# Patient Record
Sex: Male | Born: 1965
Health system: Southern US, Community
[De-identification: ages and names within clinical notes are randomized; demographics above are authoritative.]

## PROBLEM LIST (undated history)

## (undated) DIAGNOSIS — T7840XA Allergy, unspecified, initial encounter: Secondary | ICD-10-CM

## (undated) DIAGNOSIS — H4720 Unspecified optic atrophy: Secondary | ICD-10-CM

## (undated) DIAGNOSIS — G839 Paralytic syndrome, unspecified: Secondary | ICD-10-CM

## (undated) DIAGNOSIS — E785 Hyperlipidemia, unspecified: Secondary | ICD-10-CM

## (undated) DIAGNOSIS — K219 Gastro-esophageal reflux disease without esophagitis: Secondary | ICD-10-CM

## (undated) DIAGNOSIS — E119 Type 2 diabetes mellitus without complications: Secondary | ICD-10-CM

## (undated) DIAGNOSIS — I10 Essential (primary) hypertension: Secondary | ICD-10-CM

## (undated) DIAGNOSIS — G629 Polyneuropathy, unspecified: Secondary | ICD-10-CM

## (undated) HISTORY — DX: Polyneuropathy, unspecified: G62.9

## (undated) HISTORY — DX: Paralytic syndrome, unspecified: G83.9

## (undated) HISTORY — DX: Hyperlipidemia, unspecified: E78.5

## (undated) HISTORY — DX: Gastro-esophageal reflux disease without esophagitis: K21.9

## (undated) HISTORY — PX: LEG SURGERY: SHX1003

## (undated) HISTORY — DX: Unspecified optic atrophy: H47.20

## (undated) HISTORY — DX: Essential (primary) hypertension: I10

## (undated) HISTORY — DX: Type 2 diabetes mellitus without complications: E11.9

## (undated) HISTORY — DX: Allergy, unspecified, initial encounter: T78.40XA

---

## 1995-04-11 DIAGNOSIS — E119 Type 2 diabetes mellitus without complications: Secondary | ICD-10-CM

## 1995-04-11 HISTORY — DX: Type 2 diabetes mellitus without complications: E11.9

## 2013-04-27 DIAGNOSIS — R209 Unspecified disturbances of skin sensation: Secondary | ICD-10-CM | POA: Diagnosis not present

## 2013-05-22 DIAGNOSIS — E119 Type 2 diabetes mellitus without complications: Secondary | ICD-10-CM | POA: Diagnosis not present

## 2013-05-22 DIAGNOSIS — I10 Essential (primary) hypertension: Secondary | ICD-10-CM | POA: Diagnosis not present

## 2013-06-06 DIAGNOSIS — R51 Headache: Secondary | ICD-10-CM | POA: Diagnosis not present

## 2013-06-06 DIAGNOSIS — R05 Cough: Secondary | ICD-10-CM | POA: Diagnosis not present

## 2013-06-06 DIAGNOSIS — R059 Cough, unspecified: Secondary | ICD-10-CM | POA: Diagnosis not present

## 2013-06-06 DIAGNOSIS — J811 Chronic pulmonary edema: Secondary | ICD-10-CM | POA: Diagnosis not present

## 2013-08-24 DIAGNOSIS — D649 Anemia, unspecified: Secondary | ICD-10-CM | POA: Diagnosis not present

## 2013-08-24 DIAGNOSIS — K219 Gastro-esophageal reflux disease without esophagitis: Secondary | ICD-10-CM | POA: Diagnosis not present

## 2013-08-24 DIAGNOSIS — R279 Unspecified lack of coordination: Secondary | ICD-10-CM | POA: Diagnosis not present

## 2013-08-24 DIAGNOSIS — R609 Edema, unspecified: Secondary | ICD-10-CM | POA: Diagnosis not present

## 2013-08-24 DIAGNOSIS — IMO0001 Reserved for inherently not codable concepts without codable children: Secondary | ICD-10-CM | POA: Diagnosis not present

## 2013-08-24 DIAGNOSIS — E109 Type 1 diabetes mellitus without complications: Secondary | ICD-10-CM | POA: Diagnosis not present

## 2013-08-24 DIAGNOSIS — R51 Headache: Secondary | ICD-10-CM | POA: Diagnosis not present

## 2013-10-08 DIAGNOSIS — R05 Cough: Secondary | ICD-10-CM | POA: Diagnosis not present

## 2013-10-08 DIAGNOSIS — R059 Cough, unspecified: Secondary | ICD-10-CM | POA: Diagnosis not present

## 2013-10-11 DIAGNOSIS — R059 Cough, unspecified: Secondary | ICD-10-CM | POA: Diagnosis not present

## 2013-10-11 DIAGNOSIS — J301 Allergic rhinitis due to pollen: Secondary | ICD-10-CM | POA: Diagnosis not present

## 2013-10-11 DIAGNOSIS — R05 Cough: Secondary | ICD-10-CM | POA: Diagnosis not present

## 2013-11-20 DIAGNOSIS — E119 Type 2 diabetes mellitus without complications: Secondary | ICD-10-CM | POA: Diagnosis not present

## 2013-11-20 DIAGNOSIS — I10 Essential (primary) hypertension: Secondary | ICD-10-CM | POA: Diagnosis not present

## 2013-12-09 DIAGNOSIS — E119 Type 2 diabetes mellitus without complications: Secondary | ICD-10-CM | POA: Diagnosis not present

## 2014-01-13 DIAGNOSIS — E109 Type 1 diabetes mellitus without complications: Secondary | ICD-10-CM | POA: Diagnosis not present

## 2014-01-13 DIAGNOSIS — K219 Gastro-esophageal reflux disease without esophagitis: Secondary | ICD-10-CM | POA: Diagnosis not present

## 2014-01-13 DIAGNOSIS — R279 Unspecified lack of coordination: Secondary | ICD-10-CM | POA: Diagnosis not present

## 2014-01-13 DIAGNOSIS — R51 Headache: Secondary | ICD-10-CM | POA: Diagnosis not present

## 2014-01-15 DIAGNOSIS — I10 Essential (primary) hypertension: Secondary | ICD-10-CM | POA: Diagnosis not present

## 2014-01-15 DIAGNOSIS — E119 Type 2 diabetes mellitus without complications: Secondary | ICD-10-CM | POA: Diagnosis not present

## 2014-02-07 DIAGNOSIS — E785 Hyperlipidemia, unspecified: Secondary | ICD-10-CM | POA: Diagnosis not present

## 2014-02-07 DIAGNOSIS — I10 Essential (primary) hypertension: Secondary | ICD-10-CM | POA: Diagnosis not present

## 2014-02-07 DIAGNOSIS — H540 Blindness, both eyes: Secondary | ICD-10-CM | POA: Diagnosis not present

## 2014-02-07 DIAGNOSIS — G0481 Other encephalitis and encephalomyelitis: Secondary | ICD-10-CM | POA: Diagnosis not present

## 2014-02-07 DIAGNOSIS — D509 Iron deficiency anemia, unspecified: Secondary | ICD-10-CM | POA: Diagnosis not present

## 2014-02-25 DIAGNOSIS — I10 Essential (primary) hypertension: Secondary | ICD-10-CM | POA: Diagnosis not present

## 2014-02-26 DIAGNOSIS — E119 Type 2 diabetes mellitus without complications: Secondary | ICD-10-CM | POA: Diagnosis not present

## 2014-02-26 DIAGNOSIS — I10 Essential (primary) hypertension: Secondary | ICD-10-CM | POA: Diagnosis not present

## 2014-03-23 DIAGNOSIS — I10 Essential (primary) hypertension: Secondary | ICD-10-CM | POA: Diagnosis not present

## 2014-03-23 DIAGNOSIS — H540 Blindness, both eyes: Secondary | ICD-10-CM | POA: Diagnosis not present

## 2014-03-23 DIAGNOSIS — E119 Type 2 diabetes mellitus without complications: Secondary | ICD-10-CM | POA: Diagnosis not present

## 2014-03-23 DIAGNOSIS — E785 Hyperlipidemia, unspecified: Secondary | ICD-10-CM | POA: Diagnosis not present

## 2014-03-23 DIAGNOSIS — G808 Other cerebral palsy: Secondary | ICD-10-CM | POA: Diagnosis not present

## 2014-05-27 DIAGNOSIS — I10 Essential (primary) hypertension: Secondary | ICD-10-CM | POA: Diagnosis not present

## 2014-05-27 DIAGNOSIS — E785 Hyperlipidemia, unspecified: Secondary | ICD-10-CM | POA: Diagnosis not present

## 2014-05-27 DIAGNOSIS — D649 Anemia, unspecified: Secondary | ICD-10-CM | POA: Diagnosis not present

## 2014-05-27 DIAGNOSIS — G370 Diffuse sclerosis of central nervous system: Secondary | ICD-10-CM | POA: Diagnosis not present

## 2014-05-27 DIAGNOSIS — E108 Type 1 diabetes mellitus with unspecified complications: Secondary | ICD-10-CM | POA: Diagnosis not present

## 2014-05-27 DIAGNOSIS — G8110 Spastic hemiplegia affecting unspecified side: Secondary | ICD-10-CM | POA: Diagnosis not present

## 2014-06-02 DIAGNOSIS — E119 Type 2 diabetes mellitus without complications: Secondary | ICD-10-CM | POA: Diagnosis not present

## 2014-07-16 DIAGNOSIS — H540 Blindness, both eyes: Secondary | ICD-10-CM | POA: Diagnosis not present

## 2014-07-16 DIAGNOSIS — D649 Anemia, unspecified: Secondary | ICD-10-CM | POA: Diagnosis not present

## 2014-07-16 DIAGNOSIS — R252 Cramp and spasm: Secondary | ICD-10-CM | POA: Diagnosis not present

## 2014-07-16 DIAGNOSIS — E785 Hyperlipidemia, unspecified: Secondary | ICD-10-CM | POA: Diagnosis not present

## 2014-07-16 DIAGNOSIS — I10 Essential (primary) hypertension: Secondary | ICD-10-CM | POA: Diagnosis not present

## 2014-07-16 DIAGNOSIS — E109 Type 1 diabetes mellitus without complications: Secondary | ICD-10-CM | POA: Diagnosis not present

## 2014-10-01 DIAGNOSIS — D649 Anemia, unspecified: Secondary | ICD-10-CM | POA: Diagnosis not present

## 2014-10-01 DIAGNOSIS — H540 Blindness, both eyes: Secondary | ICD-10-CM | POA: Diagnosis not present

## 2014-10-01 DIAGNOSIS — I10 Essential (primary) hypertension: Secondary | ICD-10-CM | POA: Diagnosis not present

## 2014-10-01 DIAGNOSIS — R252 Cramp and spasm: Secondary | ICD-10-CM | POA: Diagnosis not present

## 2014-10-01 DIAGNOSIS — E785 Hyperlipidemia, unspecified: Secondary | ICD-10-CM | POA: Diagnosis not present

## 2014-10-01 DIAGNOSIS — E109 Type 1 diabetes mellitus without complications: Secondary | ICD-10-CM | POA: Diagnosis not present

## 2014-11-12 DIAGNOSIS — I10 Essential (primary) hypertension: Secondary | ICD-10-CM | POA: Diagnosis not present

## 2014-11-12 DIAGNOSIS — E119 Type 2 diabetes mellitus without complications: Secondary | ICD-10-CM | POA: Diagnosis not present

## 2014-12-02 DIAGNOSIS — E119 Type 2 diabetes mellitus without complications: Secondary | ICD-10-CM | POA: Diagnosis not present

## 2014-12-02 DIAGNOSIS — R252 Cramp and spasm: Secondary | ICD-10-CM | POA: Diagnosis not present

## 2014-12-02 DIAGNOSIS — D649 Anemia, unspecified: Secondary | ICD-10-CM | POA: Diagnosis not present

## 2014-12-02 DIAGNOSIS — I10 Essential (primary) hypertension: Secondary | ICD-10-CM | POA: Diagnosis not present

## 2014-12-02 DIAGNOSIS — E785 Hyperlipidemia, unspecified: Secondary | ICD-10-CM | POA: Diagnosis not present

## 2014-12-02 DIAGNOSIS — H540 Blindness, both eyes: Secondary | ICD-10-CM | POA: Diagnosis not present

## 2014-12-08 DIAGNOSIS — J309 Allergic rhinitis, unspecified: Secondary | ICD-10-CM | POA: Diagnosis not present

## 2014-12-08 DIAGNOSIS — G609 Hereditary and idiopathic neuropathy, unspecified: Secondary | ICD-10-CM | POA: Diagnosis not present

## 2014-12-08 DIAGNOSIS — E785 Hyperlipidemia, unspecified: Secondary | ICD-10-CM | POA: Diagnosis not present

## 2014-12-08 DIAGNOSIS — H540 Blindness, both eyes: Secondary | ICD-10-CM | POA: Diagnosis not present

## 2014-12-08 DIAGNOSIS — E109 Type 1 diabetes mellitus without complications: Secondary | ICD-10-CM | POA: Diagnosis not present

## 2014-12-08 DIAGNOSIS — K219 Gastro-esophageal reflux disease without esophagitis: Secondary | ICD-10-CM | POA: Diagnosis not present

## 2014-12-08 DIAGNOSIS — G4700 Insomnia, unspecified: Secondary | ICD-10-CM | POA: Diagnosis not present

## 2014-12-08 DIAGNOSIS — I10 Essential (primary) hypertension: Secondary | ICD-10-CM | POA: Diagnosis not present

## 2014-12-08 DIAGNOSIS — D649 Anemia, unspecified: Secondary | ICD-10-CM | POA: Diagnosis not present

## 2014-12-08 DIAGNOSIS — M6281 Muscle weakness (generalized): Secondary | ICD-10-CM | POA: Diagnosis not present

## 2014-12-08 DIAGNOSIS — M62838 Other muscle spasm: Secondary | ICD-10-CM | POA: Diagnosis not present

## 2014-12-14 DIAGNOSIS — G4700 Insomnia, unspecified: Secondary | ICD-10-CM | POA: Diagnosis not present

## 2014-12-14 DIAGNOSIS — J309 Allergic rhinitis, unspecified: Secondary | ICD-10-CM | POA: Diagnosis not present

## 2014-12-14 DIAGNOSIS — I10 Essential (primary) hypertension: Secondary | ICD-10-CM | POA: Diagnosis not present

## 2014-12-14 DIAGNOSIS — G609 Hereditary and idiopathic neuropathy, unspecified: Secondary | ICD-10-CM | POA: Diagnosis not present

## 2014-12-14 DIAGNOSIS — E785 Hyperlipidemia, unspecified: Secondary | ICD-10-CM | POA: Diagnosis not present

## 2014-12-14 DIAGNOSIS — K219 Gastro-esophageal reflux disease without esophagitis: Secondary | ICD-10-CM | POA: Diagnosis not present

## 2014-12-14 DIAGNOSIS — E109 Type 1 diabetes mellitus without complications: Secondary | ICD-10-CM | POA: Diagnosis not present

## 2014-12-14 DIAGNOSIS — D649 Anemia, unspecified: Secondary | ICD-10-CM | POA: Diagnosis not present

## 2014-12-14 DIAGNOSIS — H540 Blindness, both eyes: Secondary | ICD-10-CM | POA: Diagnosis not present

## 2014-12-14 DIAGNOSIS — M6281 Muscle weakness (generalized): Secondary | ICD-10-CM | POA: Diagnosis not present

## 2014-12-14 DIAGNOSIS — M62838 Other muscle spasm: Secondary | ICD-10-CM | POA: Diagnosis not present

## 2014-12-15 DIAGNOSIS — M6281 Muscle weakness (generalized): Secondary | ICD-10-CM | POA: Diagnosis not present

## 2014-12-15 DIAGNOSIS — K219 Gastro-esophageal reflux disease without esophagitis: Secondary | ICD-10-CM | POA: Diagnosis not present

## 2014-12-15 DIAGNOSIS — H540 Blindness, both eyes: Secondary | ICD-10-CM | POA: Diagnosis not present

## 2014-12-15 DIAGNOSIS — D649 Anemia, unspecified: Secondary | ICD-10-CM | POA: Diagnosis not present

## 2014-12-15 DIAGNOSIS — I10 Essential (primary) hypertension: Secondary | ICD-10-CM | POA: Diagnosis not present

## 2014-12-15 DIAGNOSIS — E109 Type 1 diabetes mellitus without complications: Secondary | ICD-10-CM | POA: Diagnosis not present

## 2014-12-16 DIAGNOSIS — K219 Gastro-esophageal reflux disease without esophagitis: Secondary | ICD-10-CM | POA: Diagnosis not present

## 2014-12-16 DIAGNOSIS — M6281 Muscle weakness (generalized): Secondary | ICD-10-CM | POA: Diagnosis not present

## 2014-12-16 DIAGNOSIS — E109 Type 1 diabetes mellitus without complications: Secondary | ICD-10-CM | POA: Diagnosis not present

## 2014-12-16 DIAGNOSIS — I10 Essential (primary) hypertension: Secondary | ICD-10-CM | POA: Diagnosis not present

## 2014-12-16 DIAGNOSIS — H540 Blindness, both eyes: Secondary | ICD-10-CM | POA: Diagnosis not present

## 2014-12-16 DIAGNOSIS — D649 Anemia, unspecified: Secondary | ICD-10-CM | POA: Diagnosis not present

## 2014-12-17 DIAGNOSIS — M6281 Muscle weakness (generalized): Secondary | ICD-10-CM | POA: Diagnosis not present

## 2014-12-17 DIAGNOSIS — D649 Anemia, unspecified: Secondary | ICD-10-CM | POA: Diagnosis not present

## 2014-12-17 DIAGNOSIS — H540 Blindness, both eyes: Secondary | ICD-10-CM | POA: Diagnosis not present

## 2014-12-17 DIAGNOSIS — K219 Gastro-esophageal reflux disease without esophagitis: Secondary | ICD-10-CM | POA: Diagnosis not present

## 2014-12-17 DIAGNOSIS — I10 Essential (primary) hypertension: Secondary | ICD-10-CM | POA: Diagnosis not present

## 2014-12-17 DIAGNOSIS — E109 Type 1 diabetes mellitus without complications: Secondary | ICD-10-CM | POA: Diagnosis not present

## 2014-12-18 DIAGNOSIS — E109 Type 1 diabetes mellitus without complications: Secondary | ICD-10-CM | POA: Diagnosis not present

## 2014-12-18 DIAGNOSIS — M6281 Muscle weakness (generalized): Secondary | ICD-10-CM | POA: Diagnosis not present

## 2014-12-18 DIAGNOSIS — K219 Gastro-esophageal reflux disease without esophagitis: Secondary | ICD-10-CM | POA: Diagnosis not present

## 2014-12-18 DIAGNOSIS — D649 Anemia, unspecified: Secondary | ICD-10-CM | POA: Diagnosis not present

## 2014-12-18 DIAGNOSIS — I10 Essential (primary) hypertension: Secondary | ICD-10-CM | POA: Diagnosis not present

## 2014-12-18 DIAGNOSIS — H540 Blindness, both eyes: Secondary | ICD-10-CM | POA: Diagnosis not present

## 2014-12-21 DIAGNOSIS — D649 Anemia, unspecified: Secondary | ICD-10-CM | POA: Diagnosis not present

## 2014-12-21 DIAGNOSIS — M6281 Muscle weakness (generalized): Secondary | ICD-10-CM | POA: Diagnosis not present

## 2014-12-21 DIAGNOSIS — E109 Type 1 diabetes mellitus without complications: Secondary | ICD-10-CM | POA: Diagnosis not present

## 2014-12-21 DIAGNOSIS — K219 Gastro-esophageal reflux disease without esophagitis: Secondary | ICD-10-CM | POA: Diagnosis not present

## 2014-12-21 DIAGNOSIS — I10 Essential (primary) hypertension: Secondary | ICD-10-CM | POA: Diagnosis not present

## 2014-12-21 DIAGNOSIS — H540 Blindness, both eyes: Secondary | ICD-10-CM | POA: Diagnosis not present

## 2014-12-22 DIAGNOSIS — D649 Anemia, unspecified: Secondary | ICD-10-CM | POA: Diagnosis not present

## 2014-12-22 DIAGNOSIS — E109 Type 1 diabetes mellitus without complications: Secondary | ICD-10-CM | POA: Diagnosis not present

## 2014-12-22 DIAGNOSIS — K219 Gastro-esophageal reflux disease without esophagitis: Secondary | ICD-10-CM | POA: Diagnosis not present

## 2014-12-22 DIAGNOSIS — M6281 Muscle weakness (generalized): Secondary | ICD-10-CM | POA: Diagnosis not present

## 2014-12-22 DIAGNOSIS — I10 Essential (primary) hypertension: Secondary | ICD-10-CM | POA: Diagnosis not present

## 2014-12-22 DIAGNOSIS — H540 Blindness, both eyes: Secondary | ICD-10-CM | POA: Diagnosis not present

## 2014-12-23 DIAGNOSIS — K219 Gastro-esophageal reflux disease without esophagitis: Secondary | ICD-10-CM | POA: Diagnosis not present

## 2014-12-23 DIAGNOSIS — D649 Anemia, unspecified: Secondary | ICD-10-CM | POA: Diagnosis not present

## 2014-12-23 DIAGNOSIS — I10 Essential (primary) hypertension: Secondary | ICD-10-CM | POA: Diagnosis not present

## 2014-12-23 DIAGNOSIS — M6281 Muscle weakness (generalized): Secondary | ICD-10-CM | POA: Diagnosis not present

## 2014-12-23 DIAGNOSIS — H540 Blindness, both eyes: Secondary | ICD-10-CM | POA: Diagnosis not present

## 2014-12-23 DIAGNOSIS — E109 Type 1 diabetes mellitus without complications: Secondary | ICD-10-CM | POA: Diagnosis not present

## 2014-12-24 DIAGNOSIS — K219 Gastro-esophageal reflux disease without esophagitis: Secondary | ICD-10-CM | POA: Diagnosis not present

## 2014-12-24 DIAGNOSIS — I10 Essential (primary) hypertension: Secondary | ICD-10-CM | POA: Diagnosis not present

## 2014-12-24 DIAGNOSIS — E109 Type 1 diabetes mellitus without complications: Secondary | ICD-10-CM | POA: Diagnosis not present

## 2014-12-24 DIAGNOSIS — M6281 Muscle weakness (generalized): Secondary | ICD-10-CM | POA: Diagnosis not present

## 2014-12-24 DIAGNOSIS — D649 Anemia, unspecified: Secondary | ICD-10-CM | POA: Diagnosis not present

## 2014-12-24 DIAGNOSIS — H540 Blindness, both eyes: Secondary | ICD-10-CM | POA: Diagnosis not present

## 2014-12-25 DIAGNOSIS — K219 Gastro-esophageal reflux disease without esophagitis: Secondary | ICD-10-CM | POA: Diagnosis not present

## 2014-12-25 DIAGNOSIS — D649 Anemia, unspecified: Secondary | ICD-10-CM | POA: Diagnosis not present

## 2014-12-25 DIAGNOSIS — E109 Type 1 diabetes mellitus without complications: Secondary | ICD-10-CM | POA: Diagnosis not present

## 2014-12-25 DIAGNOSIS — M6281 Muscle weakness (generalized): Secondary | ICD-10-CM | POA: Diagnosis not present

## 2014-12-25 DIAGNOSIS — H540 Blindness, both eyes: Secondary | ICD-10-CM | POA: Diagnosis not present

## 2014-12-25 DIAGNOSIS — I10 Essential (primary) hypertension: Secondary | ICD-10-CM | POA: Diagnosis not present

## 2015-01-14 DIAGNOSIS — K219 Gastro-esophageal reflux disease without esophagitis: Secondary | ICD-10-CM | POA: Diagnosis not present

## 2015-01-14 DIAGNOSIS — E109 Type 1 diabetes mellitus without complications: Secondary | ICD-10-CM | POA: Diagnosis not present

## 2015-01-14 DIAGNOSIS — G609 Hereditary and idiopathic neuropathy, unspecified: Secondary | ICD-10-CM | POA: Diagnosis not present

## 2015-01-14 DIAGNOSIS — I10 Essential (primary) hypertension: Secondary | ICD-10-CM | POA: Diagnosis not present

## 2015-01-14 DIAGNOSIS — M6281 Muscle weakness (generalized): Secondary | ICD-10-CM | POA: Diagnosis not present

## 2015-01-14 DIAGNOSIS — D649 Anemia, unspecified: Secondary | ICD-10-CM | POA: Diagnosis not present

## 2015-01-14 DIAGNOSIS — H540 Blindness, both eyes: Secondary | ICD-10-CM | POA: Diagnosis not present

## 2015-01-14 DIAGNOSIS — M62838 Other muscle spasm: Secondary | ICD-10-CM | POA: Diagnosis not present

## 2015-01-14 DIAGNOSIS — G4700 Insomnia, unspecified: Secondary | ICD-10-CM | POA: Diagnosis not present

## 2015-01-14 DIAGNOSIS — J309 Allergic rhinitis, unspecified: Secondary | ICD-10-CM | POA: Diagnosis not present

## 2015-01-14 DIAGNOSIS — E785 Hyperlipidemia, unspecified: Secondary | ICD-10-CM | POA: Diagnosis not present

## 2015-01-21 DIAGNOSIS — E785 Hyperlipidemia, unspecified: Secondary | ICD-10-CM | POA: Diagnosis not present

## 2015-01-21 DIAGNOSIS — R252 Cramp and spasm: Secondary | ICD-10-CM | POA: Diagnosis not present

## 2015-01-21 DIAGNOSIS — I10 Essential (primary) hypertension: Secondary | ICD-10-CM | POA: Diagnosis not present

## 2015-01-21 DIAGNOSIS — H540 Blindness, both eyes: Secondary | ICD-10-CM | POA: Diagnosis not present

## 2015-01-21 DIAGNOSIS — D649 Anemia, unspecified: Secondary | ICD-10-CM | POA: Diagnosis not present

## 2015-01-21 DIAGNOSIS — E119 Type 2 diabetes mellitus without complications: Secondary | ICD-10-CM | POA: Diagnosis not present

## 2015-02-11 DIAGNOSIS — E119 Type 2 diabetes mellitus without complications: Secondary | ICD-10-CM | POA: Diagnosis not present

## 2015-02-11 DIAGNOSIS — I10 Essential (primary) hypertension: Secondary | ICD-10-CM | POA: Diagnosis not present

## 2015-02-17 DIAGNOSIS — I10 Essential (primary) hypertension: Secondary | ICD-10-CM | POA: Diagnosis not present

## 2015-02-18 DIAGNOSIS — J069 Acute upper respiratory infection, unspecified: Secondary | ICD-10-CM | POA: Diagnosis not present

## 2015-03-09 DIAGNOSIS — H2513 Age-related nuclear cataract, bilateral: Secondary | ICD-10-CM | POA: Diagnosis not present

## 2015-03-09 DIAGNOSIS — H5502 Latent nystagmus: Secondary | ICD-10-CM | POA: Diagnosis not present

## 2015-03-09 DIAGNOSIS — E119 Type 2 diabetes mellitus without complications: Secondary | ICD-10-CM | POA: Diagnosis not present

## 2015-03-09 DIAGNOSIS — Z794 Long term (current) use of insulin: Secondary | ICD-10-CM | POA: Diagnosis not present

## 2015-03-18 DIAGNOSIS — R252 Cramp and spasm: Secondary | ICD-10-CM | POA: Diagnosis not present

## 2015-03-18 DIAGNOSIS — H540 Blindness, both eyes: Secondary | ICD-10-CM | POA: Diagnosis not present

## 2015-03-18 DIAGNOSIS — D649 Anemia, unspecified: Secondary | ICD-10-CM | POA: Diagnosis not present

## 2015-03-18 DIAGNOSIS — E784 Other hyperlipidemia: Secondary | ICD-10-CM | POA: Diagnosis not present

## 2015-03-18 DIAGNOSIS — I10 Essential (primary) hypertension: Secondary | ICD-10-CM | POA: Diagnosis not present

## 2015-03-18 DIAGNOSIS — E109 Type 1 diabetes mellitus without complications: Secondary | ICD-10-CM | POA: Diagnosis not present

## 2015-05-03 DIAGNOSIS — F419 Anxiety disorder, unspecified: Secondary | ICD-10-CM | POA: Diagnosis not present

## 2015-05-03 DIAGNOSIS — G4700 Insomnia, unspecified: Secondary | ICD-10-CM | POA: Diagnosis not present

## 2015-05-08 DIAGNOSIS — E109 Type 1 diabetes mellitus without complications: Secondary | ICD-10-CM | POA: Diagnosis not present

## 2015-05-08 DIAGNOSIS — I10 Essential (primary) hypertension: Secondary | ICD-10-CM | POA: Diagnosis not present

## 2015-05-08 DIAGNOSIS — E784 Other hyperlipidemia: Secondary | ICD-10-CM | POA: Diagnosis not present

## 2015-05-08 DIAGNOSIS — H540 Blindness, both eyes: Secondary | ICD-10-CM | POA: Diagnosis not present

## 2015-05-08 DIAGNOSIS — D649 Anemia, unspecified: Secondary | ICD-10-CM | POA: Diagnosis not present

## 2015-05-08 DIAGNOSIS — R252 Cramp and spasm: Secondary | ICD-10-CM | POA: Diagnosis not present

## 2015-06-07 DIAGNOSIS — F419 Anxiety disorder, unspecified: Secondary | ICD-10-CM | POA: Diagnosis not present

## 2015-06-07 DIAGNOSIS — G4700 Insomnia, unspecified: Secondary | ICD-10-CM | POA: Diagnosis not present

## 2015-06-08 DIAGNOSIS — E119 Type 2 diabetes mellitus without complications: Secondary | ICD-10-CM | POA: Diagnosis not present

## 2015-06-08 DIAGNOSIS — I10 Essential (primary) hypertension: Secondary | ICD-10-CM | POA: Diagnosis not present

## 2015-07-02 DIAGNOSIS — G609 Hereditary and idiopathic neuropathy, unspecified: Secondary | ICD-10-CM | POA: Diagnosis not present

## 2015-07-02 DIAGNOSIS — D649 Anemia, unspecified: Secondary | ICD-10-CM | POA: Diagnosis not present

## 2015-07-02 DIAGNOSIS — K219 Gastro-esophageal reflux disease without esophagitis: Secondary | ICD-10-CM | POA: Diagnosis not present

## 2015-07-02 DIAGNOSIS — E785 Hyperlipidemia, unspecified: Secondary | ICD-10-CM | POA: Diagnosis not present

## 2015-07-02 DIAGNOSIS — R252 Cramp and spasm: Secondary | ICD-10-CM | POA: Diagnosis not present

## 2015-07-02 DIAGNOSIS — M6281 Muscle weakness (generalized): Secondary | ICD-10-CM | POA: Diagnosis not present

## 2015-07-02 DIAGNOSIS — H540 Blindness, both eyes: Secondary | ICD-10-CM | POA: Diagnosis not present

## 2015-07-02 DIAGNOSIS — E784 Other hyperlipidemia: Secondary | ICD-10-CM | POA: Diagnosis not present

## 2015-07-02 DIAGNOSIS — G4700 Insomnia, unspecified: Secondary | ICD-10-CM | POA: Diagnosis not present

## 2015-07-02 DIAGNOSIS — E109 Type 1 diabetes mellitus without complications: Secondary | ICD-10-CM | POA: Diagnosis not present

## 2015-07-02 DIAGNOSIS — I10 Essential (primary) hypertension: Secondary | ICD-10-CM | POA: Diagnosis not present

## 2015-07-02 DIAGNOSIS — M62838 Other muscle spasm: Secondary | ICD-10-CM | POA: Diagnosis not present

## 2015-07-02 DIAGNOSIS — J309 Allergic rhinitis, unspecified: Secondary | ICD-10-CM | POA: Diagnosis not present

## 2015-07-04 DIAGNOSIS — D649 Anemia, unspecified: Secondary | ICD-10-CM | POA: Diagnosis not present

## 2015-07-04 DIAGNOSIS — M6281 Muscle weakness (generalized): Secondary | ICD-10-CM | POA: Diagnosis not present

## 2015-07-04 DIAGNOSIS — G609 Hereditary and idiopathic neuropathy, unspecified: Secondary | ICD-10-CM | POA: Diagnosis not present

## 2015-07-04 DIAGNOSIS — I10 Essential (primary) hypertension: Secondary | ICD-10-CM | POA: Diagnosis not present

## 2015-07-04 DIAGNOSIS — K219 Gastro-esophageal reflux disease without esophagitis: Secondary | ICD-10-CM | POA: Diagnosis not present

## 2015-07-04 DIAGNOSIS — E109 Type 1 diabetes mellitus without complications: Secondary | ICD-10-CM | POA: Diagnosis not present

## 2015-07-05 DIAGNOSIS — K219 Gastro-esophageal reflux disease without esophagitis: Secondary | ICD-10-CM | POA: Diagnosis not present

## 2015-07-05 DIAGNOSIS — M6281 Muscle weakness (generalized): Secondary | ICD-10-CM | POA: Diagnosis not present

## 2015-07-05 DIAGNOSIS — I10 Essential (primary) hypertension: Secondary | ICD-10-CM | POA: Diagnosis not present

## 2015-07-05 DIAGNOSIS — D649 Anemia, unspecified: Secondary | ICD-10-CM | POA: Diagnosis not present

## 2015-07-05 DIAGNOSIS — G609 Hereditary and idiopathic neuropathy, unspecified: Secondary | ICD-10-CM | POA: Diagnosis not present

## 2015-07-05 DIAGNOSIS — E109 Type 1 diabetes mellitus without complications: Secondary | ICD-10-CM | POA: Diagnosis not present

## 2015-07-06 DIAGNOSIS — G609 Hereditary and idiopathic neuropathy, unspecified: Secondary | ICD-10-CM | POA: Diagnosis not present

## 2015-07-06 DIAGNOSIS — I10 Essential (primary) hypertension: Secondary | ICD-10-CM | POA: Diagnosis not present

## 2015-07-06 DIAGNOSIS — M6281 Muscle weakness (generalized): Secondary | ICD-10-CM | POA: Diagnosis not present

## 2015-07-06 DIAGNOSIS — K219 Gastro-esophageal reflux disease without esophagitis: Secondary | ICD-10-CM | POA: Diagnosis not present

## 2015-07-06 DIAGNOSIS — E109 Type 1 diabetes mellitus without complications: Secondary | ICD-10-CM | POA: Diagnosis not present

## 2015-07-06 DIAGNOSIS — D649 Anemia, unspecified: Secondary | ICD-10-CM | POA: Diagnosis not present

## 2015-07-07 DIAGNOSIS — E109 Type 1 diabetes mellitus without complications: Secondary | ICD-10-CM | POA: Diagnosis not present

## 2015-07-07 DIAGNOSIS — M6281 Muscle weakness (generalized): Secondary | ICD-10-CM | POA: Diagnosis not present

## 2015-07-07 DIAGNOSIS — K219 Gastro-esophageal reflux disease without esophagitis: Secondary | ICD-10-CM | POA: Diagnosis not present

## 2015-07-07 DIAGNOSIS — I10 Essential (primary) hypertension: Secondary | ICD-10-CM | POA: Diagnosis not present

## 2015-07-07 DIAGNOSIS — G609 Hereditary and idiopathic neuropathy, unspecified: Secondary | ICD-10-CM | POA: Diagnosis not present

## 2015-07-07 DIAGNOSIS — D649 Anemia, unspecified: Secondary | ICD-10-CM | POA: Diagnosis not present

## 2015-07-08 DIAGNOSIS — K219 Gastro-esophageal reflux disease without esophagitis: Secondary | ICD-10-CM | POA: Diagnosis not present

## 2015-07-08 DIAGNOSIS — I10 Essential (primary) hypertension: Secondary | ICD-10-CM | POA: Diagnosis not present

## 2015-07-08 DIAGNOSIS — E109 Type 1 diabetes mellitus without complications: Secondary | ICD-10-CM | POA: Diagnosis not present

## 2015-07-08 DIAGNOSIS — G609 Hereditary and idiopathic neuropathy, unspecified: Secondary | ICD-10-CM | POA: Diagnosis not present

## 2015-07-08 DIAGNOSIS — M6281 Muscle weakness (generalized): Secondary | ICD-10-CM | POA: Diagnosis not present

## 2015-07-08 DIAGNOSIS — D649 Anemia, unspecified: Secondary | ICD-10-CM | POA: Diagnosis not present

## 2015-07-11 DIAGNOSIS — E109 Type 1 diabetes mellitus without complications: Secondary | ICD-10-CM | POA: Diagnosis not present

## 2015-07-11 DIAGNOSIS — E785 Hyperlipidemia, unspecified: Secondary | ICD-10-CM | POA: Diagnosis not present

## 2015-07-11 DIAGNOSIS — M62838 Other muscle spasm: Secondary | ICD-10-CM | POA: Diagnosis not present

## 2015-07-11 DIAGNOSIS — G609 Hereditary and idiopathic neuropathy, unspecified: Secondary | ICD-10-CM | POA: Diagnosis not present

## 2015-07-11 DIAGNOSIS — M439 Deforming dorsopathy, unspecified: Secondary | ICD-10-CM | POA: Diagnosis not present

## 2015-07-11 DIAGNOSIS — H540 Blindness, both eyes: Secondary | ICD-10-CM | POA: Diagnosis not present

## 2015-07-11 DIAGNOSIS — D649 Anemia, unspecified: Secondary | ICD-10-CM | POA: Diagnosis not present

## 2015-07-11 DIAGNOSIS — I10 Essential (primary) hypertension: Secondary | ICD-10-CM | POA: Diagnosis not present

## 2015-07-11 DIAGNOSIS — K219 Gastro-esophageal reflux disease without esophagitis: Secondary | ICD-10-CM | POA: Diagnosis not present

## 2015-07-11 DIAGNOSIS — M419 Scoliosis, unspecified: Secondary | ICD-10-CM | POA: Diagnosis not present

## 2015-07-19 ENCOUNTER — Ambulatory Visit: Payer: Self-pay | Admitting: Family Medicine

## 2015-07-21 ENCOUNTER — Ambulatory Visit (INDEPENDENT_AMBULATORY_CARE_PROVIDER_SITE_OTHER): Payer: Medicare Other | Admitting: Family Medicine

## 2015-07-21 ENCOUNTER — Encounter: Payer: Self-pay | Admitting: Family Medicine

## 2015-07-21 VITALS — BP 137/89 | HR 112 | Temp 97.8°F

## 2015-07-21 DIAGNOSIS — E1159 Type 2 diabetes mellitus with other circulatory complications: Secondary | ICD-10-CM | POA: Insufficient documentation

## 2015-07-21 DIAGNOSIS — E109 Type 1 diabetes mellitus without complications: Secondary | ICD-10-CM | POA: Diagnosis not present

## 2015-07-21 DIAGNOSIS — G4700 Insomnia, unspecified: Secondary | ICD-10-CM | POA: Diagnosis not present

## 2015-07-21 DIAGNOSIS — IMO0001 Reserved for inherently not codable concepts without codable children: Secondary | ICD-10-CM

## 2015-07-21 DIAGNOSIS — E1069 Type 1 diabetes mellitus with other specified complication: Secondary | ICD-10-CM | POA: Insufficient documentation

## 2015-07-21 DIAGNOSIS — E785 Hyperlipidemia, unspecified: Secondary | ICD-10-CM

## 2015-07-21 DIAGNOSIS — I152 Hypertension secondary to endocrine disorders: Secondary | ICD-10-CM | POA: Insufficient documentation

## 2015-07-21 DIAGNOSIS — Z114 Encounter for screening for human immunodeficiency virus [HIV]: Secondary | ICD-10-CM

## 2015-07-21 DIAGNOSIS — E782 Mixed hyperlipidemia: Secondary | ICD-10-CM

## 2015-07-21 DIAGNOSIS — I10 Essential (primary) hypertension: Secondary | ICD-10-CM | POA: Diagnosis not present

## 2015-07-21 DIAGNOSIS — IMO0002 Reserved for concepts with insufficient information to code with codable children: Secondary | ICD-10-CM | POA: Insufficient documentation

## 2015-07-21 DIAGNOSIS — E1065 Type 1 diabetes mellitus with hyperglycemia: Secondary | ICD-10-CM

## 2015-07-21 DIAGNOSIS — E1165 Type 2 diabetes mellitus with hyperglycemia: Secondary | ICD-10-CM | POA: Insufficient documentation

## 2015-07-21 MED ORDER — "SYRINGE 25G X 5/8"" 3 ML MISC": 1.0000 | Freq: Four times a day (QID) | Status: DC

## 2015-07-21 MED ORDER — CARVEDILOL 25 MG PO TABS: 25.0000 mg | ORAL_TABLET | Freq: Two times a day (BID) | ORAL | Status: DC

## 2015-07-21 NOTE — Progress Notes (Signed)
BP 137/89 mmHg  Pulse 112  Temp(Src) 97.8 F (36.6 C) (Oral)  Ht   Wt    Subjective:    Patient ID: Brady Rios, male    DOB: 02-28-1966, 50 y.o.   MRN: [id number]  HPI: Brady B. Paci is a 50 y.o. male presenting on 07/21/2015 for Establish Care   HPI Hypertension Patient is coming in today to establish care with our practice. He is a new patient to our practice and recently left the nursing home. For hypertension he is currently on Norvasc and Coreg and lisinopril. Patient denies headaches, blurred vision, chest pains, shortness of breath, or weakness. Denies any side effects from medication and is content with current medication.   Hyperlipidemia. Patient is currently on Lipitor 10 mg for his cholesterol. We do not have any lab values in our system so do not know where he is running on this. We will check labs today.  Type 1 diabetes Patient says he has been a type I diabetic since his late 18s. He has been on insulin and insulin alone since that time. He is currently on Levemir 30 units at bedtime and NovoLog 12 units 3 times a day before meals. He denies any issues with his feet or his eyes or his kidneys that he knows of. We do not have any lab values to confirm or deny this this point. He has not seen an ophthalmologist within the past year.  Insomnia Patient is using Elavil currently for insomnia and has used other agents previously was in the nursing home but he feels like he hasn't needed this much since his nursing home living with his new wife.  Relevant past medical, surgical, family and social history reviewed and updated as indicated. Interim medical history since our last visit reviewed. Allergies and medications reviewed and updated.  Review of Systems  Constitutional: Negative for fever and chills.  HENT: Negative for congestion, ear discharge and ear pain.   Eyes: Negative for discharge and visual disturbance.  Respiratory: Negative for shortness of breath  and wheezing.   Cardiovascular: Negative for chest pain and leg swelling.  Gastrointestinal: Negative for abdominal pain, diarrhea, constipation and abdominal distention.  Genitourinary: Negative for difficulty urinating.  Musculoskeletal: Negative for back pain and gait problem.  Skin: Negative for color change and rash.  Neurological: Negative for dizziness, syncope, weakness, light-headedness, numbness and headaches.  All other systems reviewed and are negative.   Per HPI unless specifically indicated above  Social History   Social History  . Marital Status: Married    Spouse Name: N/A  . Number of Children: N/A  . Years of Education: N/A   Occupational History  . Not on file.   Social History Main Topics  . Smoking status: Never Smoker   . Smokeless tobacco: Never Used  . Alcohol Use: No  . Drug Use: No  . Sexual Activity: Yes     Comment: with spouse since 2015   Other Topics Concern  . Not on file   Social History Narrative  . No narrative on file    Past Surgical History  Procedure Laterality Date  . Leg surgery      as a child to try to correct legs    Family History  Problem Relation Age of Onset  . Alzheimer's disease Mother   . Cancer Brother     gall bladder and liver      Medication List       This list  is accurate as of: 07/21/15  4:34 PM.  Always use your most recent med list.               amitriptyline 25 MG tablet  Commonly known as:  ELAVIL  Take 25 mg by mouth at bedtime.     amLODipine 2.5 MG tablet  Commonly known as:  NORVASC  Take 2.5 mg by mouth daily.     atorvastatin 10 MG tablet  Commonly known as:  LIPITOR  Take 10 mg by mouth daily.     baclofen 10 MG tablet  Commonly known as:  LIORESAL  Take 10 mg by mouth 3 (three) times daily. 1/2 tablet three times a day as needed for hiccups     carvedilol 25 MG tablet  Commonly known as:  COREG  Take 1 tablet (25 mg total) by mouth 2 (two) times daily with a meal.      famotidine 20 MG tablet  Commonly known as:  PEPCID  Take 20 mg by mouth 2 (two) times daily.     fluticasone 50 MCG/ACT nasal spray  Commonly known as:  FLONASE  Place 2 sprays into both nostrils daily.     HYDROcodone-acetaminophen 5-325 MG tablet  Commonly known as:  NORCO/VICODIN  Take 1 tablet by mouth every 4 (four) hours as needed for moderate pain.     insulin aspart 100 UNIT/ML injection  Commonly known as:  novoLOG  Inject 12 Units into the skin 3 (three) times daily before meals. Sliding scale if above 200 - 2 units for every 50     insulin detemir 100 UNIT/ML injection  Commonly known as:  LEVEMIR  Inject 30 Units into the skin at bedtime.     lisinopril 20 MG tablet  Commonly known as:  PRINIVIL,ZESTRIL  Take 20 mg by mouth daily.     SINGULAIR 10 MG tablet  Generic drug:  montelukast  Take 10 mg by mouth at bedtime.     SYRINGE 3CC/25GX5/8" 25G X 5/8" 3 ML Misc  1 each by Does not apply route 4 (four) times daily.     zolpidem 5 MG tablet  Commonly known as:  AMBIEN  Take 5 mg by mouth at bedtime as needed for sleep. 1/2 tablet at bedtime as needed           Objective:    BP 137/89 mmHg  Pulse 112  Temp(Src) 97.8 F (36.6 C) (Oral)  Ht   Wt   Wt Readings from Last 3 Encounters:  No data found for Wt    Physical Exam  Constitutional: He is oriented to person, place, and time. He appears well-developed and well-nourished. No distress.  Eyes: Conjunctivae and EOM are normal. Pupils are equal, round, and reactive to light. Right eye exhibits no discharge. No scleral icterus.  Neck: Neck supple. No thyromegaly present.  Cardiovascular: Normal rate, regular rhythm, normal heart sounds and intact distal pulses.   No murmur heard. Pulmonary/Chest: Effort normal and breath sounds normal. No respiratory distress. He has no wheezes.  Abdominal: Soft. Bowel sounds are normal. He exhibits no distension. There is no tenderness. There is no rebound.    Musculoskeletal: Normal range of motion. He exhibits no edema.  Lymphadenopathy:    He has no cervical adenopathy.  Neurological: He is alert and oriented to person, place, and time. Coordination normal.  Skin: Skin is warm and dry. No rash noted. He is not diaphoretic.  Psychiatric: He has a normal mood and affect.  His behavior is normal.  Vitals reviewed.   No results found for this or any previous visit.    Assessment & Plan:   Problem List Items Addressed This Visit      Cardiovascular and Mediastinum   Hypertension - Primary   Relevant Medications   atorvastatin (LIPITOR) 10 MG tablet   lisinopril (PRINIVIL,ZESTRIL) 20 MG tablet   amLODipine (NORVASC) 2.5 MG tablet   carvedilol (COREG) 25 MG tablet   Other Relevant Orders   CMP14+EGFR     Endocrine   Diabetes type 1, uncontrolled (HCC)   Relevant Medications   atorvastatin (LIPITOR) 10 MG tablet   lisinopril (PRINIVIL,ZESTRIL) 20 MG tablet   insulin detemir (LEVEMIR) 100 UNIT/ML injection   insulin aspart (NOVOLOG) 100 UNIT/ML injection   Syringe/Needle, Disp, (SYRINGE 3CC/25GX5/8") 25G X 5/8" 3 ML MISC   Other Relevant Orders   CMP14+EGFR   TSH   Bayer DCA Hb A1c Waived   Ambulatory referral to Ophthalmology     Other   Hyperlipidemia LDL goal <100   Relevant Medications   atorvastatin (LIPITOR) 10 MG tablet   lisinopril (PRINIVIL,ZESTRIL) 20 MG tablet   amLODipine (NORVASC) 2.5 MG tablet   carvedilol (COREG) 25 MG tablet   Other Relevant Orders   Lipid panel   Insomnia   Relevant Medications   amitriptyline (ELAVIL) 25 MG tablet    Other Visit Diagnoses    Encounter for screening for HIV        Relevant Orders    HIV antibody        Follow up plan: Return in about 3 months (around 10/20/2015), or if symptoms worsen or fail to improve, for recheck diabetes, set up with Tammy ASAP.  Caryl Pina, MD [facility] 07/21/2015, 4:34 PM

## 2015-07-22 DIAGNOSIS — E109 Type 1 diabetes mellitus without complications: Secondary | ICD-10-CM | POA: Insufficient documentation

## 2015-07-26 DIAGNOSIS — M419 Scoliosis, unspecified: Secondary | ICD-10-CM | POA: Diagnosis not present

## 2015-07-26 DIAGNOSIS — M62838 Other muscle spasm: Secondary | ICD-10-CM | POA: Diagnosis not present

## 2015-07-26 DIAGNOSIS — M439 Deforming dorsopathy, unspecified: Secondary | ICD-10-CM | POA: Diagnosis not present

## 2015-07-26 DIAGNOSIS — E109 Type 1 diabetes mellitus without complications: Secondary | ICD-10-CM | POA: Diagnosis not present

## 2015-07-26 DIAGNOSIS — I10 Essential (primary) hypertension: Secondary | ICD-10-CM | POA: Diagnosis not present

## 2015-07-26 DIAGNOSIS — H540 Blindness, both eyes: Secondary | ICD-10-CM | POA: Diagnosis not present

## 2015-07-28 DIAGNOSIS — M419 Scoliosis, unspecified: Secondary | ICD-10-CM | POA: Diagnosis not present

## 2015-07-28 DIAGNOSIS — M439 Deforming dorsopathy, unspecified: Secondary | ICD-10-CM | POA: Diagnosis not present

## 2015-07-28 DIAGNOSIS — E109 Type 1 diabetes mellitus without complications: Secondary | ICD-10-CM | POA: Diagnosis not present

## 2015-07-28 DIAGNOSIS — I10 Essential (primary) hypertension: Secondary | ICD-10-CM | POA: Diagnosis not present

## 2015-07-28 DIAGNOSIS — H540 Blindness, both eyes: Secondary | ICD-10-CM | POA: Diagnosis not present

## 2015-07-28 DIAGNOSIS — M62838 Other muscle spasm: Secondary | ICD-10-CM | POA: Diagnosis not present

## 2015-08-03 DIAGNOSIS — M419 Scoliosis, unspecified: Secondary | ICD-10-CM | POA: Diagnosis not present

## 2015-08-03 DIAGNOSIS — I10 Essential (primary) hypertension: Secondary | ICD-10-CM | POA: Diagnosis not present

## 2015-08-03 DIAGNOSIS — H540 Blindness, both eyes: Secondary | ICD-10-CM | POA: Diagnosis not present

## 2015-08-03 DIAGNOSIS — M439 Deforming dorsopathy, unspecified: Secondary | ICD-10-CM | POA: Diagnosis not present

## 2015-08-03 DIAGNOSIS — E109 Type 1 diabetes mellitus without complications: Secondary | ICD-10-CM | POA: Diagnosis not present

## 2015-08-03 DIAGNOSIS — M62838 Other muscle spasm: Secondary | ICD-10-CM | POA: Diagnosis not present

## 2015-08-05 DIAGNOSIS — I10 Essential (primary) hypertension: Secondary | ICD-10-CM | POA: Diagnosis not present

## 2015-08-05 DIAGNOSIS — M62838 Other muscle spasm: Secondary | ICD-10-CM | POA: Diagnosis not present

## 2015-08-05 DIAGNOSIS — M439 Deforming dorsopathy, unspecified: Secondary | ICD-10-CM | POA: Diagnosis not present

## 2015-08-05 DIAGNOSIS — M419 Scoliosis, unspecified: Secondary | ICD-10-CM | POA: Diagnosis not present

## 2015-08-05 DIAGNOSIS — E109 Type 1 diabetes mellitus without complications: Secondary | ICD-10-CM | POA: Diagnosis not present

## 2015-08-05 DIAGNOSIS — H540 Blindness, both eyes: Secondary | ICD-10-CM | POA: Diagnosis not present

## 2015-08-06 DIAGNOSIS — M439 Deforming dorsopathy, unspecified: Secondary | ICD-10-CM | POA: Diagnosis not present

## 2015-08-06 DIAGNOSIS — M419 Scoliosis, unspecified: Secondary | ICD-10-CM | POA: Diagnosis not present

## 2015-08-06 DIAGNOSIS — E109 Type 1 diabetes mellitus without complications: Secondary | ICD-10-CM | POA: Diagnosis not present

## 2015-08-06 DIAGNOSIS — H540 Blindness, both eyes: Secondary | ICD-10-CM | POA: Diagnosis not present

## 2015-08-06 DIAGNOSIS — M62838 Other muscle spasm: Secondary | ICD-10-CM | POA: Diagnosis not present

## 2015-08-06 DIAGNOSIS — I10 Essential (primary) hypertension: Secondary | ICD-10-CM | POA: Diagnosis not present

## 2015-08-09 DIAGNOSIS — M62838 Other muscle spasm: Secondary | ICD-10-CM | POA: Diagnosis not present

## 2015-08-09 DIAGNOSIS — E109 Type 1 diabetes mellitus without complications: Secondary | ICD-10-CM | POA: Diagnosis not present

## 2015-08-09 DIAGNOSIS — H540 Blindness, both eyes: Secondary | ICD-10-CM | POA: Diagnosis not present

## 2015-08-09 DIAGNOSIS — M439 Deforming dorsopathy, unspecified: Secondary | ICD-10-CM | POA: Diagnosis not present

## 2015-08-09 DIAGNOSIS — I10 Essential (primary) hypertension: Secondary | ICD-10-CM | POA: Diagnosis not present

## 2015-08-09 DIAGNOSIS — M419 Scoliosis, unspecified: Secondary | ICD-10-CM | POA: Diagnosis not present

## 2015-08-10 ENCOUNTER — Other Ambulatory Visit: Payer: Self-pay | Admitting: *Deleted

## 2015-08-10 DIAGNOSIS — I10 Essential (primary) hypertension: Secondary | ICD-10-CM

## 2015-08-10 DIAGNOSIS — G4700 Insomnia, unspecified: Secondary | ICD-10-CM

## 2015-08-10 MED ORDER — LISINOPRIL 20 MG PO TABS: 20.0000 mg | ORAL_TABLET | Freq: Every day | ORAL | Status: DC

## 2015-08-10 MED ORDER — AMITRIPTYLINE HCL 25 MG PO TABS: 25.0000 mg | ORAL_TABLET | Freq: Every day | ORAL | Status: DC

## 2015-08-11 DIAGNOSIS — E109 Type 1 diabetes mellitus without complications: Secondary | ICD-10-CM | POA: Diagnosis not present

## 2015-08-11 DIAGNOSIS — H540 Blindness, both eyes: Secondary | ICD-10-CM | POA: Diagnosis not present

## 2015-08-11 DIAGNOSIS — M439 Deforming dorsopathy, unspecified: Secondary | ICD-10-CM | POA: Diagnosis not present

## 2015-08-11 DIAGNOSIS — M62838 Other muscle spasm: Secondary | ICD-10-CM | POA: Diagnosis not present

## 2015-08-11 DIAGNOSIS — I10 Essential (primary) hypertension: Secondary | ICD-10-CM | POA: Diagnosis not present

## 2015-08-11 DIAGNOSIS — M419 Scoliosis, unspecified: Secondary | ICD-10-CM | POA: Diagnosis not present

## 2015-08-12 ENCOUNTER — Other Ambulatory Visit: Payer: Self-pay

## 2015-08-12 MED ORDER — FAMOTIDINE 20 MG PO TABS: 20.0000 mg | ORAL_TABLET | Freq: Two times a day (BID) | ORAL | Status: DC

## 2015-08-17 ENCOUNTER — Telehealth: Payer: Self-pay | Admitting: Family Medicine

## 2015-08-17 NOTE — Telephone Encounter (Signed)
Spoke with patient about check. He states that [facility] [place] still getting his [place] just like when he was living there. Advised patient that he needs to contact nursing home and have this changed. He then states that the nursing home sent all the info to an office in [place] and he was waiting on them to change it. Advised patient that this was the Drs office in [place] and he should contact this office in [place] to have this changed. While patient was talking wife was cursing in the back ground and yelling that he needs a letter sent so he can get his check.

## 2015-08-19 ENCOUNTER — Other Ambulatory Visit: Payer: Self-pay

## 2015-08-19 MED ORDER — MONTELUKAST SODIUM 10 MG PO TABS: 10.0000 mg | ORAL_TABLET | Freq: Every day | ORAL | Status: DC

## 2015-08-27 ENCOUNTER — Telehealth: Payer: Self-pay | Admitting: Family Medicine

## 2015-08-27 ENCOUNTER — Other Ambulatory Visit: Payer: Self-pay

## 2015-08-27 ENCOUNTER — Telehealth: Payer: Self-pay

## 2015-08-27 DIAGNOSIS — IMO0001 Reserved for inherently not codable concepts without codable children: Secondary | ICD-10-CM

## 2015-08-27 DIAGNOSIS — E1065 Type 1 diabetes mellitus with hyperglycemia: Principal | ICD-10-CM

## 2015-08-27 MED ORDER — ONETOUCH ULTRASOFT LANCETS MISC: Status: DC

## 2015-08-27 MED ORDER — INSULIN ASPART 100 UNIT/ML ~~LOC~~ SOLN: 12.0000 [IU] | Freq: Three times a day (TID) | SUBCUTANEOUS | Status: DC

## 2015-08-27 MED ORDER — AMLODIPINE BESYLATE 2.5 MG PO TABS: 2.5000 mg | ORAL_TABLET | Freq: Every day | ORAL | Status: DC

## 2015-08-27 MED ORDER — ZOLPIDEM TARTRATE 5 MG PO TABS: 5.0000 mg | ORAL_TABLET | Freq: Every evening | ORAL | Status: DC | PRN

## 2015-08-27 MED ORDER — BACLOFEN 10 MG PO TABS: 10.0000 mg | ORAL_TABLET | Freq: Three times a day (TID) | ORAL | Status: DC

## 2015-08-27 MED ORDER — INSULIN DETEMIR 100 UNIT/ML ~~LOC~~ SOLN
30.0000 [IU] | Freq: Every day | SUBCUTANEOUS | Status: DC
Start: 2015-08-27 — End: 2015-11-20

## 2015-08-27 NOTE — Telephone Encounter (Signed)
Contacted patient, he said he has an appointment for 10/20/15 and will wait until then to have his paperwork filled out.

## 2015-08-27 NOTE — Telephone Encounter (Signed)
LM for patient as to what supplies she needs

## 2015-08-27 NOTE — Telephone Encounter (Signed)
Last seen 07/21/15  Dr Dettinger  Zolpidem was not on med list  If approved route to nurse to call int [facility]

## 2015-08-27 NOTE — Telephone Encounter (Signed)
Patient has a form to be filled out to allow him to have his SS checks sent to his account instead of managed by someone else.  Per Dr. Louanne Skyeettinger, he has only seen this patient once and so he will need to be seen for assessment so he can complete the form.  I left a message on the patient's voicemail to return my call.

## 2015-08-27 NOTE — Telephone Encounter (Signed)
Printed Ambien, the others were sent over.

## 2015-08-30 ENCOUNTER — Other Ambulatory Visit: Payer: Self-pay

## 2015-08-30 MED ORDER — GLUCOSE BLOOD VI STRP: ORAL_STRIP | Status: DC

## 2015-09-02 ENCOUNTER — Other Ambulatory Visit: Payer: Self-pay | Admitting: *Deleted

## 2015-09-02 DIAGNOSIS — E785 Hyperlipidemia, unspecified: Secondary | ICD-10-CM

## 2015-09-02 MED ORDER — ATORVASTATIN CALCIUM 10 MG PO TABS: 10.0000 mg | ORAL_TABLET | Freq: Every day | ORAL | Status: DC

## 2015-09-02 NOTE — Telephone Encounter (Signed)
No labs

## 2015-09-10 ENCOUNTER — Telehealth: Payer: Self-pay | Admitting: Family Medicine

## 2015-09-14 ENCOUNTER — Telehealth: Payer: Self-pay | Admitting: Family Medicine

## 2015-09-14 NOTE — Telephone Encounter (Signed)
Advised pt he would need an appt with Dr.Dettinger per the last phone call when pt had called inquiring about the same matter. Pt states he will call back to schedule once he gets the form that needs to be filled out.

## 2015-09-27 ENCOUNTER — Other Ambulatory Visit: Payer: Self-pay | Admitting: Family Medicine

## 2015-09-28 MED ORDER — GLUCOSE BLOOD VI STRP: ORAL_STRIP | Status: DC

## 2015-09-28 NOTE — Telephone Encounter (Signed)
done

## 2015-10-08 ENCOUNTER — Other Ambulatory Visit: Payer: Self-pay | Admitting: Family Medicine

## 2015-10-20 ENCOUNTER — Ambulatory Visit (INDEPENDENT_AMBULATORY_CARE_PROVIDER_SITE_OTHER): Payer: Medicare Other | Admitting: Family Medicine

## 2015-10-20 VITALS — BP 137/81 | HR 93 | Temp 98.9°F

## 2015-10-20 DIAGNOSIS — E1065 Type 1 diabetes mellitus with hyperglycemia: Principal | ICD-10-CM

## 2015-10-20 DIAGNOSIS — IMO0001 Reserved for inherently not codable concepts without codable children: Secondary | ICD-10-CM

## 2015-10-20 DIAGNOSIS — I10 Essential (primary) hypertension: Secondary | ICD-10-CM | POA: Diagnosis not present

## 2015-10-20 DIAGNOSIS — E785 Hyperlipidemia, unspecified: Secondary | ICD-10-CM | POA: Diagnosis not present

## 2015-10-20 DIAGNOSIS — E109 Type 1 diabetes mellitus without complications: Secondary | ICD-10-CM | POA: Diagnosis not present

## 2015-10-20 DIAGNOSIS — Z7289 Other problems related to lifestyle: Secondary | ICD-10-CM | POA: Diagnosis not present

## 2015-10-20 DIAGNOSIS — Z114 Encounter for screening for human immunodeficiency virus [HIV]: Secondary | ICD-10-CM

## 2015-10-20 LAB — BAYER DCA HB A1C WAIVED: HB A1C: 7.1 % — AB (ref <7.0)

## 2015-10-20 MED ORDER — ONETOUCH DELICA LANCETS 33G MISC: 1.0000 | Freq: Four times a day (QID) | Status: DC

## 2015-10-20 NOTE — Progress Notes (Signed)
BP 137/81 mmHg  Pulse 93  Temp(Src) 98.9 F (37.2 C) (Oral)  Wt    Subjective:    Patient ID: Brady Rios, male    DOB: 12/03/1965, 50 y.o.   MRN: [id number]  HPI: Brady B. Colgan is a 50 y.o. male presenting on 10/20/2015 for Diabetes and form for social security money   HPI Type 1 diabetes Patient is coming in today for a checkup on his type 1 diabetes. He is currently using NovoLog 12 units 3 times a day with meals and Levemir 30 units at bedtime. He brings in glucose log with him and he has occasional 67 and 72 a few in that range but not too frequently and then he also has a few a.m. blood sugars that are up in the 200s but says those are most often related to when he eats a lot the night before. Patient is wheelchair bound and unable to ambulate because of leg surgery and a back surgery as a child that ended up leaving him wheelchair bound. Because of this he does not have good sensation in his feet. He has not seen an ophthalmologist in just over a year. He has had some issues with his eyes. He denies any sores or ulcerations on his feet. Patient is on an ACE inhibitor.  Hypertension recheck Patient is also coming in today for hypertension recheck. He is currently on lisinopril. His blood pressure today is 137/81. Patient denies headaches, blurred vision, chest pains, shortness of breath, or weakness. Denies any side effects from medication and is content with current medication.   Hyperlipidemia recheck Patient is coming in today as well for a cholesterol recheck. Patient is currently on Lipitor 10 mg. He denies any side effects from the medication or myalgias or any liver issues that he knows of.  Relevant past medical, surgical, family and social history reviewed and updated as indicated. Interim medical history since our last visit reviewed. Allergies and medications reviewed and updated.  Review of Systems  Constitutional: Negative for fever and chills.  HENT: Negative  for ear discharge and ear pain.   Eyes: Negative for discharge and visual disturbance.  Respiratory: Negative for shortness of breath and wheezing.   Cardiovascular: Negative for chest pain and leg swelling.  Gastrointestinal: Negative for abdominal pain, diarrhea and constipation.  Genitourinary: Negative for difficulty urinating.  Musculoskeletal: Negative for back pain and gait problem.  Skin: Negative for rash.  Neurological: Positive for weakness and numbness. Negative for syncope, light-headedness and headaches.  All other systems reviewed and are negative.   Per HPI unless specifically indicated above     Medication List       This list is accurate as of: 10/20/15  3:43 PM.  Always use your most recent med list.               amitriptyline 25 MG tablet  Commonly known as:  ELAVIL  TAKE ONE TABLET AT BEDTIME     amLODipine 2.5 MG tablet  Commonly known as:  NORVASC  Take 1 tablet (2.5 mg total) by mouth daily.     atorvastatin 10 MG tablet  Commonly known as:  LIPITOR  Take 1 tablet (10 mg total) by mouth daily.     carvedilol 25 MG tablet  Commonly known as:  COREG  Take 1 tablet (25 mg total) by mouth 2 (two) times daily with a meal.     famotidine 20 MG tablet  Commonly known as:  PEPCID  Take 1 tablet (20 mg total) by mouth 2 (two) times daily.     fluticasone 50 MCG/ACT nasal spray  Commonly known as:  FLONASE  Place 2 sprays into both nostrils daily.     glucose blood test strip  Commonly known as:  ONETOUCH VERIO  Test tid. DX- E10.9     HYDROcodone-acetaminophen 5-325 MG tablet  Commonly known as:  NORCO/VICODIN  Take 1 tablet by mouth every 4 (four) hours as needed for moderate pain.     insulin aspart 100 UNIT/ML injection  Commonly known as:  novoLOG  Inject 12 Units into the skin 3 (three) times daily before meals. Sliding scale if above 200 - 2 units for every 50     insulin detemir 100 UNIT/ML injection  Commonly known as:  LEVEMIR    Inject 0.3 mLs (30 Units total) into the skin at bedtime.     lisinopril 20 MG tablet  Commonly known as:  PRINIVIL,ZESTRIL  Take 1 tablet (20 mg total) by mouth daily.     montelukast 10 MG tablet  Commonly known as:  SINGULAIR  Take 1 tablet (10 mg total) by mouth at bedtime.     ONETOUCH DELICA LANCETS 58K Misc  1 each by Does not apply route 4 (four) times daily.     SYRINGE 3CC/25GX5/8" 25G X 5/8" 3 ML Misc  1 each by Does not apply route 4 (four) times daily.           Objective:    BP 137/81 mmHg  Pulse 93  Temp(Src) 98.9 F (37.2 C) (Oral)  Wt   Wt Readings from Last 3 Encounters:  No data found for Wt    Physical Exam  Constitutional: He is oriented to person, place, and time. He appears well-developed and well-nourished. No distress.  Eyes: Conjunctivae and EOM are normal. Pupils are equal, round, and reactive to light. Right eye exhibits no discharge. No scleral icterus.  Neck: Neck supple. No thyromegaly present.  Cardiovascular: Normal rate, regular rhythm, normal heart sounds and intact distal pulses.   No murmur heard. Pulmonary/Chest: Effort normal and breath sounds normal. No respiratory distress. He has no wheezes.  Musculoskeletal: Normal range of motion. He exhibits no edema.  Lymphadenopathy:    He has no cervical adenopathy.  Neurological: He is alert and oriented to person, place, and time. Coordination normal.  Skin: Skin is warm and dry. No rash noted. He is not diaphoretic.  Psychiatric: He has a normal mood and affect. His behavior is normal.  Nursing note and vitals reviewed.   No results found for this or any previous visit.    Assessment & Plan:   Problem List Items Addressed This Visit      Cardiovascular and Mediastinum   Hypertension     Endocrine   Diabetes type 1, uncontrolled ([place]) - Primary   Relevant Medications   ONETOUCH DELICA LANCETS 99I MISC   Other Relevant Orders   Bayer DCA Hb A1c Waived (Completed)    CMP14+EGFR (Completed)   Lipid panel (Completed)   Microalbumin / creatinine urine ratio   Ambulatory referral to Ophthalmology     Other   Hyperlipidemia LDL goal <100   Relevant Orders   Lipid panel (Completed)    Other Visit Diagnoses    Screening for HIV without presence of risk factors        Relevant Orders    HIV antibody (Completed)        Follow up plan: Return  in about 3 months (around 01/20/2016), or if symptoms worsen or fail to improve, for Recheck diabetes and hypertension.  Counseling provided for all of the vaccine components Orders Placed This Encounter  Procedures  . Bayer DCA Hb A1c Waived  . CMP14+EGFR  . Lipid panel  . Microalbumin / creatinine urine ratio  . HIV antibody  . Ambulatory referral to Ophthalmology    Caryl Pina, MD [facility] 10/20/2015, 3:43 PM

## 2015-10-21 LAB — CMP14+EGFR
A/G RATIO: 2 (ref 1.2–2.2)
ALBUMIN: 4.1 g/dL (ref 3.5–5.5)
ALK PHOS: 98 IU/L (ref 39–117)
ALT: 27 IU/L (ref 0–44)
AST: 27 IU/L (ref 0–40)
BILIRUBIN TOTAL: 0.7 mg/dL (ref 0.0–1.2)
BUN / CREAT RATIO: 19 (ref 9–20)
BUN: 12 mg/dL (ref 6–24)
CHLORIDE: 101 mmol/L (ref 96–106)
CO2: 25 mmol/L (ref 18–29)
Calcium: 8.5 mg/dL — ABNORMAL LOW (ref 8.7–10.2)
Creatinine, Ser: 0.62 mg/dL — ABNORMAL LOW (ref 0.76–1.27)
GFR calc Af Amer: 133 mL/min/{1.73_m2} (ref >59)
GFR calc non Af Amer: 115 mL/min/{1.73_m2} (ref >59)
GLOBULIN, TOTAL: 2.1 g/dL (ref 1.5–4.5)
Glucose: 99 mg/dL (ref 65–99)
POTASSIUM: 4.2 mmol/L (ref 3.5–5.2)
SODIUM: 142 mmol/L (ref 134–144)
Total Protein: 6.2 g/dL (ref 6.0–8.5)

## 2015-10-21 LAB — HIV ANTIBODY (ROUTINE TESTING W REFLEX): HIV Screen 4th Generation wRfx: NONREACTIVE

## 2015-10-21 LAB — LIPID PANEL
CHOLESTEROL TOTAL: 111 mg/dL (ref 100–199)
Chol/HDL Ratio: 2.8 ratio units (ref 0.0–5.0)
HDL: 40 mg/dL (ref >39)
LDL Calculated: 28 mg/dL (ref 0–99)
TRIGLYCERIDES: 214 mg/dL — AB (ref 0–149)
VLDL Cholesterol Cal: 43 mg/dL — ABNORMAL HIGH (ref 5–40)

## 2015-11-08 ENCOUNTER — Other Ambulatory Visit: Payer: Self-pay | Admitting: Family Medicine

## 2015-11-11 ENCOUNTER — Telehealth: Payer: Self-pay | Admitting: Family Medicine

## 2015-11-11 NOTE — Telephone Encounter (Signed)
Patient aware letter had been sent. Advised patient to call social security and if we needed to re fax the form we could. Patient states he would call and then let us know.

## 2015-11-20 ENCOUNTER — Other Ambulatory Visit: Payer: Self-pay | Admitting: Family Medicine

## 2015-11-20 DIAGNOSIS — IMO0001 Reserved for inherently not codable concepts without codable children: Secondary | ICD-10-CM

## 2015-11-20 DIAGNOSIS — E1065 Type 1 diabetes mellitus with hyperglycemia: Principal | ICD-10-CM

## 2015-12-06 ENCOUNTER — Other Ambulatory Visit: Payer: Self-pay | Admitting: Family Medicine

## 2015-12-09 ENCOUNTER — Other Ambulatory Visit: Payer: Self-pay | Admitting: Family Medicine

## 2015-12-09 DIAGNOSIS — I10 Essential (primary) hypertension: Secondary | ICD-10-CM

## 2016-01-03 ENCOUNTER — Other Ambulatory Visit: Payer: Self-pay | Admitting: Family Medicine

## 2016-01-03 DIAGNOSIS — I10 Essential (primary) hypertension: Secondary | ICD-10-CM

## 2016-01-06 ENCOUNTER — Other Ambulatory Visit: Payer: Self-pay | Admitting: Family Medicine

## 2016-01-06 DIAGNOSIS — I10 Essential (primary) hypertension: Secondary | ICD-10-CM

## 2016-01-17 ENCOUNTER — Other Ambulatory Visit: Payer: Self-pay | Admitting: Family Medicine

## 2016-01-20 ENCOUNTER — Encounter: Payer: Self-pay | Admitting: Family Medicine

## 2016-01-20 ENCOUNTER — Ambulatory Visit (INDEPENDENT_AMBULATORY_CARE_PROVIDER_SITE_OTHER): Payer: Medicare Other | Admitting: Family Medicine

## 2016-01-20 VITALS — BP 137/85 | HR 86 | Temp 98.0°F

## 2016-01-20 DIAGNOSIS — Z1211 Encounter for screening for malignant neoplasm of colon: Secondary | ICD-10-CM | POA: Diagnosis not present

## 2016-01-20 DIAGNOSIS — E785 Hyperlipidemia, unspecified: Secondary | ICD-10-CM

## 2016-01-20 DIAGNOSIS — E1065 Type 1 diabetes mellitus with hyperglycemia: Secondary | ICD-10-CM

## 2016-01-20 DIAGNOSIS — IMO0001 Reserved for inherently not codable concepts without codable children: Secondary | ICD-10-CM

## 2016-01-20 DIAGNOSIS — I10 Essential (primary) hypertension: Secondary | ICD-10-CM | POA: Diagnosis not present

## 2016-01-20 LAB — BAYER DCA HB A1C WAIVED: HB A1C (BAYER DCA - WAIVED): 7.2 % — ABNORMAL HIGH (ref <7.0)

## 2016-01-20 NOTE — Assessment & Plan Note (Addendum)
Controlled except try glycerides, continue current medications.

## 2016-01-20 NOTE — Assessment & Plan Note (Signed)
Controlled, continue current medication 

## 2016-01-20 NOTE — Progress Notes (Signed)
BP 137/85   Pulse 86   Temp 98 F (36.7 C) (Oral)    Subjective:    Patient ID: Brady Rios, male    DOB: [birth year], 50 y.o.   MRN: [id number]  HPI: Brady B. Belinda FisherLovette is a 50 y.o. male presenting on 01/20/2016 for Diabetes (3 month followup); Hyperlipidemia; and Hypertension   HPI Hypertension recheck Patient is coming in for hypertension recheck. His blood pressure today is 137/85. He is currently on lisinopril and Norvasc. Patient denies headaches, blurred vision, chest pains, shortness of breath, or weakness. Denies any side effects from medication and is content with current medication.   Diabetes recheck Patient has type 1 diabetes and his last hemoglobin A1c was 7.1 3 months ago. He is currently on Levemir 30 units at bedtime and 12 units of NovoLog 3 times a day with meals. He denies any issues with either his medications. He is on an ACE inhibitor. He has not seen an ophthalmologist in the last year. He denies any issues with his feet and lateral foot exam today.  Hyperlipidemia recheck Patient is doing well on his cholesterol medication and is not due for blood draw for his cholesterol just yet. He is currently on Lipitor 10 mg.  Relevant past medical, surgical, family and social history reviewed and updated as indicated. Interim medical history since our last visit reviewed. Allergies and medications reviewed and updated.  Review of Systems  Constitutional: Negative for chills and fever.  Eyes: Negative for discharge.  Respiratory: Negative for shortness of breath and wheezing.   Cardiovascular: Negative for chest pain, palpitations and leg swelling.  Musculoskeletal: Negative for back pain and gait problem.  Skin: Negative for rash.  Neurological: Positive for weakness. Negative for dizziness, speech difficulty, light-headedness, numbness and headaches.  All other systems reviewed and are negative.   Per HPI unless specifically indicated above     Medication  List       Accurate as of 01/20/16  3:45 PM. Always use your most recent med list.          amitriptyline 25 MG tablet Commonly known as:  ELAVIL TAKE ONE TABLET AT BEDTIME   amLODipine 2.5 MG tablet Commonly known as:  NORVASC TAKE 1 TABLET DAILY   atorvastatin 10 MG tablet Commonly known as:  LIPITOR Take 1 tablet (10 mg total) by mouth daily.   carvedilol 25 MG tablet Commonly known as:  COREG Take 1 tablet (25 mg total) by mouth 2 (two) times daily with a meal.   famotidine 20 MG tablet Commonly known as:  PEPCID Take 1 tablet (20 mg total) by mouth 2 (two) times daily.   fluticasone 50 MCG/ACT nasal spray Commonly known as:  FLONASE Place 2 sprays into both nostrils daily.   glucose blood test strip Commonly known as:  ONETOUCH VERIO Test tid. DX- E10.9   HYDROcodone-acetaminophen 5-325 MG tablet Commonly known as:  NORCO/VICODIN Take 1 tablet by mouth every 4 (four) hours as needed for moderate pain.   LEVEMIR 100 UNIT/ML injection Generic drug:  insulin detemir INJECT 0.3 MLS (30 UNITS TOTAL) INTO THE SKIN AT BEDTIME.   lisinopril 20 MG tablet Commonly known as:  PRINIVIL,ZESTRIL TAKE 1 TABLET DAILY   montelukast 10 MG tablet Commonly known as:  SINGULAIR Take 1 tablet (10 mg total) by mouth at bedtime.   NOVOLOG 100 UNIT/ML injection Generic drug:  insulin aspart INJECT 12 UNITS INTO THE SKIN 3 TIMES DAILY BEFORE MEALS-SLIDING SCALE IF ABOVE 200-2  UNITS EVERY 50   ONETOUCH DELICA LANCETS 33G Misc 1 each by Does not apply route 4 (four) times daily.   SYRINGE 3CC/25GX5/8" 25G X 5/8" 3 ML Misc 1 each by Does not apply route 4 (four) times daily.          Objective:    BP 137/85   Pulse 86   Temp 98 F (36.7 C) (Oral)   Wt Readings from Last 3 Encounters:  No data found for Wt    Physical Exam  Constitutional: He is oriented to person, place, and time. He appears well-developed and well-nourished. No distress.  Eyes: Conjunctivae are  normal. Right eye exhibits no discharge. No scleral icterus.  Cardiovascular: Normal rate, regular rhythm, normal heart sounds and intact distal pulses.   No murmur heard. Pulmonary/Chest: Effort normal and breath sounds normal. No respiratory distress. He has no wheezes.  Musculoskeletal: Normal range of motion. He exhibits no edema.  Neurological: He is alert and oriented to person, place, and time. Coordination normal.  Skin: Skin is warm and dry. No rash noted. He is not diaphoretic.  Psychiatric: He has a normal mood and affect. His behavior is normal.  Nursing note and vitals reviewed.  Diabetic Foot Exam - Simple   Simple Foot Form Diabetic Foot exam was performed with the following findings:  Yes 01/20/2016  3:43 PM  Visual Inspection No deformities, no ulcerations, no other skin breakdown bilaterally:  Yes Sensation Testing Intact to touch and monofilament testing bilaterally:  Yes Pulse Check Posterior Tibialis and Dorsalis pulse intact bilaterally:  Yes Comments Patient is paralyzed below the waist but has full sensation in his feet       Assessment & Plan:   Problem List Items Addressed This Visit      Cardiovascular and Mediastinum   Hypertension    Controlled, continue current medication      Relevant Orders   Microalbumin / creatinine urine ratio     Endocrine   Diabetes type 1, uncontrolled (HCC) - Primary   Relevant Orders   Bayer DCA Hb A1c Waived   Ambulatory referral to Ophthalmology   Microalbumin / creatinine urine ratio     Other   Hyperlipidemia LDL goal <100    Controlled except try glycerides, continue current medications.       Other Visit Diagnoses    Special screening for malignant neoplasms, colon       Relevant Orders   Ambulatory referral to Gastroenterology       Follow up plan: Return in about 3 months (around 04/21/2016), or if symptoms worsen or fail to improve, for Diabetes and hypertension recheck.  Counseling provided  for all of the vaccine components Orders Placed This Encounter  Procedures  . Bayer DCA Hb A1c Waived  . Microalbumin / creatinine urine ratio  . Ambulatory referral to Ophthalmology  . Ambulatory referral to Gastroenterology    Arville Care, MD [facility] 01/20/2016, 3:45 PM

## 2016-01-21 ENCOUNTER — Encounter: Payer: Self-pay | Admitting: Internal Medicine

## 2016-01-21 ENCOUNTER — Telehealth: Payer: Self-pay | Admitting: Family Medicine

## 2016-01-21 NOTE — Telephone Encounter (Signed)
FYI Spoke with pt regarding colonoscopy Pt worried about diabetes and how it will affect BS Emphasized importance of colonoscopy Pt wants to do Cologard instead Pt instructed to call Insurance regarding coverage Pt also instructed that he should at least do FOBT Pt declined colonoscopy at this time

## 2016-01-23 NOTE — Telephone Encounter (Signed)
The colonoscopy would not significantly affect his blood sugars. But if he chooses not to do it that is fine

## 2016-01-31 ENCOUNTER — Other Ambulatory Visit: Payer: Self-pay | Admitting: Family Medicine

## 2016-02-07 ENCOUNTER — Other Ambulatory Visit: Payer: Self-pay | Admitting: Family Medicine

## 2016-02-07 DIAGNOSIS — I10 Essential (primary) hypertension: Secondary | ICD-10-CM

## 2016-02-12 ENCOUNTER — Other Ambulatory Visit: Payer: Self-pay | Admitting: Family Medicine

## 2016-02-12 DIAGNOSIS — E1065 Type 1 diabetes mellitus with hyperglycemia: Principal | ICD-10-CM

## 2016-02-12 DIAGNOSIS — IMO0001 Reserved for inherently not codable concepts without codable children: Secondary | ICD-10-CM

## 2016-02-14 ENCOUNTER — Other Ambulatory Visit: Payer: Self-pay | Admitting: Family Medicine

## 2016-02-23 ENCOUNTER — Other Ambulatory Visit: Payer: Self-pay | Admitting: Family Medicine

## 2016-02-23 DIAGNOSIS — E785 Hyperlipidemia, unspecified: Secondary | ICD-10-CM

## 2016-02-24 ENCOUNTER — Other Ambulatory Visit: Payer: Self-pay | Admitting: Family Medicine

## 2016-03-07 ENCOUNTER — Other Ambulatory Visit: Payer: Self-pay | Admitting: Family Medicine

## 2016-03-07 DIAGNOSIS — I10 Essential (primary) hypertension: Secondary | ICD-10-CM

## 2016-03-29 ENCOUNTER — Encounter: Payer: Self-pay | Admitting: Internal Medicine

## 2016-04-12 ENCOUNTER — Other Ambulatory Visit: Payer: Self-pay | Admitting: Family Medicine

## 2016-04-17 ENCOUNTER — Telehealth: Payer: Self-pay | Admitting: Family Medicine

## 2016-04-17 DIAGNOSIS — I10 Essential (primary) hypertension: Secondary | ICD-10-CM

## 2016-04-17 DIAGNOSIS — E785 Hyperlipidemia, unspecified: Secondary | ICD-10-CM

## 2016-04-17 DIAGNOSIS — E1065 Type 1 diabetes mellitus with hyperglycemia: Secondary | ICD-10-CM

## 2016-04-17 DIAGNOSIS — IMO0001 Reserved for inherently not codable concepts without codable children: Secondary | ICD-10-CM

## 2016-04-18 NOTE — Telephone Encounter (Signed)
LM for patient to call with what meds he needs

## 2016-04-19 MED ORDER — FAMOTIDINE 20 MG PO TABS: 20.0000 mg | ORAL_TABLET | Freq: Two times a day (BID) | ORAL | 0 refills | Status: DC

## 2016-04-19 MED ORDER — MONTELUKAST SODIUM 10 MG PO TABS: 10.0000 mg | ORAL_TABLET | Freq: Every day | ORAL | 0 refills | Status: DC

## 2016-04-19 MED ORDER — INSULIN ASPART 100 UNIT/ML ~~LOC~~ SOLN: SUBCUTANEOUS | 0 refills | Status: DC

## 2016-04-19 MED ORDER — LISINOPRIL 20 MG PO TABS: 20.0000 mg | ORAL_TABLET | Freq: Every day | ORAL | 0 refills | Status: DC

## 2016-04-19 MED ORDER — INSULIN DETEMIR 100 UNIT/ML ~~LOC~~ SOLN: 30.0000 [IU] | Freq: Every day | SUBCUTANEOUS | 0 refills | Status: DC

## 2016-04-19 MED ORDER — AMITRIPTYLINE HCL 25 MG PO TABS: 25.0000 mg | ORAL_TABLET | Freq: Every day | ORAL | 0 refills | Status: DC

## 2016-04-19 MED ORDER — GLUCOSE BLOOD VI STRP: ORAL_STRIP | 0 refills | Status: DC

## 2016-04-19 MED ORDER — CARVEDILOL 25 MG PO TABS: 25.0000 mg | ORAL_TABLET | Freq: Two times a day (BID) | ORAL | 0 refills | Status: DC

## 2016-04-19 MED ORDER — ATORVASTATIN CALCIUM 10 MG PO TABS
10.0000 mg | ORAL_TABLET | Freq: Every day | ORAL | 0 refills | Status: DC
Start: 2016-04-19 — End: 2016-05-04

## 2016-04-19 MED ORDER — AMLODIPINE BESYLATE 2.5 MG PO TABS: 2.5000 mg | ORAL_TABLET | Freq: Every day | ORAL | 0 refills | Status: DC

## 2016-04-19 MED ORDER — "SYRINGE 25G X 5/8"" 3 ML MISC": 1.0000 | Freq: Four times a day (QID) | 0 refills | Status: DC

## 2016-04-19 MED ORDER — SYRINGE/NEEDLE (DISP) 27G X 1-1/4" 3 ML MISC
0 refills | Status: DC
Start: 2016-04-19 — End: 2016-05-05

## 2016-04-19 MED ORDER — ATORVASTATIN CALCIUM 10 MG PO TABS: 10.0000 mg | ORAL_TABLET | Freq: Every day | ORAL | 0 refills | Status: DC

## 2016-04-19 MED ORDER — ONETOUCH DELICA LANCETS 33G MISC
1.0000 | Freq: Four times a day (QID) | 0 refills | Status: DC
Start: 2016-04-19 — End: 2016-05-05

## 2016-04-19 MED ORDER — "SYRINGE/NEEDLE (DISP) 27G X 1-1/4"" 3 ML MISC": 0 refills | Status: DC

## 2016-04-19 MED ORDER — ONETOUCH DELICA LANCETS 33G MISC: 1.0000 | Freq: Four times a day (QID) | 1 refills | Status: DC

## 2016-04-19 NOTE — Telephone Encounter (Signed)
Spoke with patient to verify which meds he would like sent to mail order. After verifying he states that he will need a small supply of meds, insulin, and syringes until mail order arrives. [facility] and all insulin and diabetic supplies sent to [facility]. Sent all meds as patient requested

## 2016-05-04 ENCOUNTER — Ambulatory Visit (INDEPENDENT_AMBULATORY_CARE_PROVIDER_SITE_OTHER): Payer: Medicare Other | Admitting: Family Medicine

## 2016-05-04 ENCOUNTER — Other Ambulatory Visit: Payer: Self-pay | Admitting: Family Medicine

## 2016-05-04 ENCOUNTER — Encounter: Payer: Self-pay | Admitting: Family Medicine

## 2016-05-04 VITALS — BP 149/82 | HR 89 | Temp 98.9°F

## 2016-05-04 DIAGNOSIS — IMO0001 Reserved for inherently not codable concepts without codable children: Secondary | ICD-10-CM

## 2016-05-04 DIAGNOSIS — H547 Unspecified visual loss: Secondary | ICD-10-CM | POA: Insufficient documentation

## 2016-05-04 DIAGNOSIS — E785 Hyperlipidemia, unspecified: Secondary | ICD-10-CM

## 2016-05-04 DIAGNOSIS — I10 Essential (primary) hypertension: Secondary | ICD-10-CM

## 2016-05-04 DIAGNOSIS — E1065 Type 1 diabetes mellitus with hyperglycemia: Principal | ICD-10-CM

## 2016-05-04 LAB — BAYER DCA HB A1C WAIVED: HB A1C (BAYER DCA - WAIVED): 6.7 % (ref <7.0)

## 2016-05-04 MED ORDER — LISINOPRIL 20 MG PO TABS: 20.0000 mg | ORAL_TABLET | Freq: Every day | ORAL | 3 refills | Status: DC

## 2016-05-04 MED ORDER — FAMOTIDINE 20 MG PO TABS: 20.0000 mg | ORAL_TABLET | Freq: Two times a day (BID) | ORAL | 3 refills | Status: DC

## 2016-05-04 MED ORDER — AMLODIPINE BESYLATE 2.5 MG PO TABS: 2.5000 mg | ORAL_TABLET | Freq: Every day | ORAL | 3 refills | Status: DC

## 2016-05-04 MED ORDER — INSULIN ASPART 100 UNIT/ML ~~LOC~~ SOLN: SUBCUTANEOUS | 3 refills | Status: DC

## 2016-05-04 MED ORDER — AMITRIPTYLINE HCL 25 MG PO TABS: 25.0000 mg | ORAL_TABLET | Freq: Every day | ORAL | 3 refills | Status: DC

## 2016-05-04 MED ORDER — ATORVASTATIN CALCIUM 10 MG PO TABS: 10.0000 mg | ORAL_TABLET | Freq: Every day | ORAL | 3 refills | Status: DC

## 2016-05-04 MED ORDER — INSULIN DETEMIR 100 UNIT/ML ~~LOC~~ SOLN: 30.0000 [IU] | Freq: Every day | SUBCUTANEOUS | 3 refills | Status: DC

## 2016-05-04 MED ORDER — MONTELUKAST SODIUM 10 MG PO TABS: 10.0000 mg | ORAL_TABLET | Freq: Every day | ORAL | 3 refills | Status: DC

## 2016-05-04 MED ORDER — CARVEDILOL 25 MG PO TABS: 25.0000 mg | ORAL_TABLET | Freq: Two times a day (BID) | ORAL | 3 refills | Status: DC

## 2016-05-04 NOTE — Progress Notes (Signed)
BP (!) 149/82   Pulse 89   Temp 98.9 F (37.2 C) (Oral)    Subjective:    Patient ID: Brady Rios, male    DOB: 1965-09-07, 51 y.o.   MRN: [id number]  HPI: Brady Rios is a 51 y.o. male presenting on 05/04/2016 for Diabetes and Hypertension   HPI Type 1 diabetes recheck Patient is coming in for a type 1 diabetes recheck. He is currently on Levemir 30 units at bedtime and NovoLog 12 units 3 times a day with meals. He says over the past couple months he has had 2 hypoglycemic episodes, one was down to 47 in the AM before he had eaten anything and he cannot recall that he eats the night before but that has not recurred and that was about a month and a half ago. The other hypoglycemic episode occurred 2 hours after breakfast 1 day when he went down to 62. He says other than that his sugars have typically gotten down to 113 and 118 and have been improved from where he was previously and are gradually getting under better control. When he had those 2 hypoglycemic episodes she gave him some juice and it came up nicely. He has not seen an ophthalmologist because he is legally blind and there is no point for him to see one for her diabetic eye exam. He denies any issues with his feet. He keeps a close eye on them. He is on an ACE inhibitor. He is also on a statin. His last chemotherapy 11 A1c was 7.2.  Hyperlipidemia recheck He is coming in today for a cholesterol recheck as well. He is currently on atorvastatin and denies any issues with myalgias from the atorvastatin. Patient denies headaches, chest pains, shortness of breath, or new weakness. Denies any side effects from medication and is content with current medication.   Hypertension recheck Patient is coming in for a hypertension recheck today. He is currently having a blood pressure 149/82. He says at home he is consistently been in the 130s over 80s and on the next visit will bring his monitor with him so we can tested against ours. He  denies any issues with this medication and he is currently on lisinopril and carvedilol and amlodipine.  Relevant past medical, surgical, family and social history reviewed and updated as indicated. Interim medical history since our last visit reviewed. Allergies and medications reviewed and updated.  Review of Systems  Constitutional: Negative for chills and fever.  Respiratory: Negative for shortness of breath and wheezing.   Cardiovascular: Negative for chest pain and leg swelling.  Musculoskeletal: Negative for back pain and gait problem.  Skin: Negative for rash.  Neurological: Negative for dizziness, light-headedness, numbness and headaches.  All other systems reviewed and are negative.   Per HPI unless specifically indicated above      Objective:    BP (!) 160/82   Pulse 96   Temp 98.9 F (37.2 C) (Oral)   Wt Readings from Last 3 Encounters:  No data found for Wt    Physical Exam  Constitutional: He is oriented to person, place, and time. He appears well-developed and well-nourished. No distress.  Eyes: Conjunctivae are normal. Right eye exhibits no discharge. Left eye exhibits no discharge. No scleral icterus.  Cardiovascular: Normal rate, regular rhythm, normal heart sounds and intact distal pulses.   No murmur heard. Pulmonary/Chest: Effort normal and breath sounds normal. No respiratory distress. He has no wheezes.  Musculoskeletal: Normal range of  motion. He exhibits no edema.  Wheelchair-bound from lower extremity weakness from an illness as a child. Also blind because of the same reason.  Neurological: He is alert and oriented to person, place, and time. Coordination normal.  Skin: Skin is warm and dry. No rash noted. He is not diaphoretic.  Psychiatric: He has a normal mood and affect. His behavior is normal.  Nursing note and vitals reviewed.   Results for orders placed or performed in visit on 01/20/16  Bayer DCA Hb A1c Waived  Result Value Ref Range    Bayer DCA Hb A1c Waived 7.2 (H) <7.0 %      Assessment & Plan:   Problem List Items Addressed This Visit      Cardiovascular and Mediastinum   Hypertension - Primary   Relevant Orders   CMP14+EGFR (Completed)     Endocrine   Diabetes type 1, uncontrolled ([place])   Relevant Orders   CMP14+EGFR (Completed)   POCT glycosylated hemoglobin (Hb A1C)   Bayer DCA Hb A1c Waived (Completed)     Other   Hyperlipidemia LDL goal <100   Relevant Orders   Lipid panel (Completed)       Follow up plan: Return in about 3 months (around 08/02/2016), or if symptoms worsen or fail to improve, for Recheck diabetes and hypertension.  Counseling provided for all of the vaccine components Orders Placed This Encounter  Procedures  . CMP14+EGFR  . Lipid panel  . POCT glycosylated hemoglobin (Hb A1C)    Caryl Pina, MD [facility] 05/04/2016, 4:23 PM

## 2016-05-05 ENCOUNTER — Telehealth: Payer: Self-pay | Admitting: Family Medicine

## 2016-05-05 DIAGNOSIS — E785 Hyperlipidemia, unspecified: Secondary | ICD-10-CM

## 2016-05-05 DIAGNOSIS — E1065 Type 1 diabetes mellitus with hyperglycemia: Secondary | ICD-10-CM

## 2016-05-05 DIAGNOSIS — IMO0001 Reserved for inherently not codable concepts without codable children: Secondary | ICD-10-CM

## 2016-05-05 DIAGNOSIS — I10 Essential (primary) hypertension: Secondary | ICD-10-CM

## 2016-05-05 LAB — CMP14+EGFR
A/G RATIO: 2.1 (ref 1.2–2.2)
ALT: 13 IU/L (ref 0–44)
AST: 21 IU/L (ref 0–40)
Albumin: 4.4 g/dL (ref 3.5–5.5)
Alkaline Phosphatase: 74 IU/L (ref 39–117)
BUN/Creatinine Ratio: 22 — ABNORMAL HIGH (ref 9–20)
BUN: 11 mg/dL (ref 6–24)
Bilirubin Total: 0.9 mg/dL (ref 0.0–1.2)
CALCIUM: 8.6 mg/dL — AB (ref 8.7–10.2)
CO2: 23 mmol/L (ref 18–29)
Chloride: 99 mmol/L (ref 96–106)
Creatinine, Ser: 0.49 mg/dL — ABNORMAL LOW (ref 0.76–1.27)
GFR calc Af Amer: 146 mL/min/{1.73_m2} (ref >59)
GFR, EST NON AFRICAN AMERICAN: 127 mL/min/{1.73_m2} (ref >59)
GLOBULIN, TOTAL: 2.1 g/dL (ref 1.5–4.5)
Glucose: 92 mg/dL (ref 65–99)
POTASSIUM: 4.3 mmol/L (ref 3.5–5.2)
SODIUM: 141 mmol/L (ref 134–144)
TOTAL PROTEIN: 6.5 g/dL (ref 6.0–8.5)

## 2016-05-05 LAB — LIPID PANEL
CHOL/HDL RATIO: 2.5 ratio (ref 0.0–5.0)
Cholesterol, Total: 126 mg/dL (ref 100–199)
HDL: 50 mg/dL (ref >39)
LDL Calculated: 32 mg/dL (ref 0–99)
Triglycerides: 219 mg/dL — ABNORMAL HIGH (ref 0–149)
VLDL Cholesterol Cal: 44 mg/dL — ABNORMAL HIGH (ref 5–40)

## 2016-05-05 LAB — MICROALBUMIN / CREATININE URINE RATIO
CREATININE, UR: 84 mg/dL
MICROALB/CREAT RATIO: 6.9 mg/g{creat} (ref 0.0–30.0)
Microalbumin, Urine: 5.8 ug/mL

## 2016-05-05 MED ORDER — INSULIN DETEMIR 100 UNIT/ML ~~LOC~~ SOLN: 30.0000 [IU] | Freq: Every day | SUBCUTANEOUS | 3 refills | Status: DC

## 2016-05-05 MED ORDER — LISINOPRIL 20 MG PO TABS: 20.0000 mg | ORAL_TABLET | Freq: Every day | ORAL | 3 refills | Status: DC

## 2016-05-05 MED ORDER — ONETOUCH DELICA LANCETS 33G MISC: 1.0000 | Freq: Four times a day (QID) | 3 refills | Status: DC

## 2016-05-05 MED ORDER — CARVEDILOL 25 MG PO TABS
25.0000 mg | ORAL_TABLET | Freq: Two times a day (BID) | ORAL | 3 refills | Status: DC
Start: 2016-05-05 — End: 2017-04-26

## 2016-05-05 MED ORDER — ATORVASTATIN CALCIUM 10 MG PO TABS: 10.0000 mg | ORAL_TABLET | Freq: Every day | ORAL | 3 refills | Status: DC

## 2016-05-05 MED ORDER — MONTELUKAST SODIUM 10 MG PO TABS: 10.0000 mg | ORAL_TABLET | Freq: Every day | ORAL | 3 refills | Status: DC

## 2016-05-05 MED ORDER — AMLODIPINE BESYLATE 2.5 MG PO TABS: 2.5000 mg | ORAL_TABLET | Freq: Every day | ORAL | 3 refills | Status: DC

## 2016-05-05 MED ORDER — "SYRINGE 25G X 5/8"" 3 ML MISC": 1.0000 | Freq: Four times a day (QID) | 3 refills | Status: DC

## 2016-05-05 MED ORDER — GLUCOSE BLOOD VI STRP: ORAL_STRIP | 0 refills | Status: DC

## 2016-05-05 MED ORDER — AMITRIPTYLINE HCL 25 MG PO TABS: 25.0000 mg | ORAL_TABLET | Freq: Every day | ORAL | 3 refills | Status: DC

## 2016-05-05 MED ORDER — FAMOTIDINE 20 MG PO TABS: 20.0000 mg | ORAL_TABLET | Freq: Two times a day (BID) | ORAL | 3 refills | Status: DC

## 2016-05-05 MED ORDER — "SYRINGE/NEEDLE (DISP) 27G X 1-1/4"" 3 ML MISC": 3 refills | Status: DC

## 2016-05-05 MED ORDER — INSULIN ASPART 100 UNIT/ML ~~LOC~~ SOLN: SUBCUTANEOUS | 3 refills | Status: DC

## 2016-05-05 NOTE — Telephone Encounter (Signed)
Rx sent to [facility] and pt is aware.

## 2016-05-05 NOTE — Telephone Encounter (Signed)
Can we figure out which one was sent to [facility] and get him sent all in for him.

## 2016-05-30 ENCOUNTER — Telehealth: Payer: Self-pay | Admitting: Family Medicine

## 2016-08-03 ENCOUNTER — Ambulatory Visit (INDEPENDENT_AMBULATORY_CARE_PROVIDER_SITE_OTHER): Payer: Medicare Other | Admitting: Family Medicine

## 2016-08-03 ENCOUNTER — Encounter (INDEPENDENT_AMBULATORY_CARE_PROVIDER_SITE_OTHER): Payer: Self-pay

## 2016-08-03 ENCOUNTER — Encounter: Payer: Self-pay | Admitting: Family Medicine

## 2016-08-03 VITALS — BP 135/78 | HR 89 | Temp 98.6°F

## 2016-08-03 DIAGNOSIS — I10 Essential (primary) hypertension: Secondary | ICD-10-CM

## 2016-08-03 DIAGNOSIS — E785 Hyperlipidemia, unspecified: Secondary | ICD-10-CM

## 2016-08-03 DIAGNOSIS — E1039 Type 1 diabetes mellitus with other diabetic ophthalmic complication: Secondary | ICD-10-CM | POA: Diagnosis not present

## 2016-08-03 LAB — BAYER DCA HB A1C WAIVED: HB A1C: 6.1 % (ref <7.0)

## 2016-08-03 NOTE — Progress Notes (Signed)
BP 135/78   Pulse 89   Temp 98.6 F (37 C) (Oral)    Subjective:    Patient ID: Brady Rios, male    DOB: 12/05/1965, 51 y.o.   MRN: [id number]  HPI: Brady Rios is a 51 y.o. male presenting on 08/03/2016 for Diabetes (followup)   HPI Type 1 diabetes mellitus Patient comes in today for recheck of his diabetes. Patient has been currently taking Levemir 30 and NovoLog 12 3 times a day with meals. Patient is currently on an ACE inhibitor. Patient has not seen an ophthalmologist this year because he is are legally blind. Patient denies any issues with his feet.   Hyperlipidemia Patient is coming in for recheck of his hyperlipidemia. He is currently taking Lipitor. He denies any issues with myalgias or history of liver damage from it. He denies any focal numbness or weakness or chest pain.  Hypertension recheck Patient comes in for blood pressure recheck and his blood pressure today is 135/78. He is currently on amlodipine 2.5 mg and Coreg 25 twice a day and lisinopril 20 daily. Patient denies headaches, blurred vision, chest pains, shortness of breath, or weakness. Denies any side effects from medication and is content with current medication.   Relevant past medical, surgical, family and social history reviewed and updated as indicated. Interim medical history since our last visit reviewed. Allergies and medications reviewed and updated.  Review of Systems  Constitutional: Negative for chills and fever.  Eyes: Positive for visual disturbance (Patient is legally blind). Negative for discharge.  Respiratory: Negative for shortness of breath and wheezing.   Cardiovascular: Negative for chest pain and leg swelling.  Musculoskeletal: Negative for back pain and gait problem.  Skin: Negative for rash.  Neurological: Positive for weakness (Patient is paralyzed from the waist down and in a wheelchair). Negative for dizziness, light-headedness and numbness.  All other systems reviewed  and are negative.  Per HPI unless specifically indicated above     Objective:    BP 135/78   Pulse 89   Temp 98.6 F (37 C) (Oral)   Wt Readings from Last 3 Encounters:  No data found for Wt    Physical Exam  Constitutional: He is oriented to person, place, and time. He appears well-developed and well-nourished. No distress.  Eyes: Conjunctivae are normal. No scleral icterus.  Neck: Neck supple. No thyromegaly present.  Cardiovascular: Normal rate, regular rhythm, normal heart sounds and intact distal pulses.   No murmur heard. Pulmonary/Chest: Effort normal and breath sounds normal. No respiratory distress. He has no wheezes. He has no rales.  Musculoskeletal: Normal range of motion. He exhibits no edema.  Lymphadenopathy:    He has no cervical adenopathy.  Neurological: He is alert and oriented to person, place, and time. Coordination normal.  Skin: Skin is warm and dry. No rash noted. He is not diaphoretic.  Psychiatric: He has a normal mood and affect. His behavior is normal.  Nursing note and vitals reviewed.     Assessment & Plan:   Problem List Items Addressed This Visit      Cardiovascular and Mediastinum   Hypertension - Primary     Endocrine   Diabetes type 1, uncontrolled (HCC)     Other   Hyperlipidemia LDL goal <100       Follow up plan: Return in about 6 months (around 02/02/2017), or if symptoms worsen or fail to improve, for Diabetes and cholesterol recheck.  Counseling provided for all of the  vaccine components Orders Placed This Encounter  Procedures  . Bayer [facility] Hb A1c Waived    Arville Care, MD [facility] 08/03/2016, 3:28 PM

## 2016-08-15 ENCOUNTER — Ambulatory Visit (INDEPENDENT_AMBULATORY_CARE_PROVIDER_SITE_OTHER): Payer: Medicare Other

## 2016-08-15 VITALS — BP 129/75 | HR 85 | Temp 98.2°F

## 2016-08-15 DIAGNOSIS — Z0000 Encounter for general adult medical examination without abnormal findings: Secondary | ICD-10-CM

## 2016-08-15 NOTE — Patient Instructions (Signed)
  Brady Rios , Thank you for taking time to come for your Medicare Wellness Visit. I appreciate your ongoing commitment to your health goals. Please review the following plan we discussed and let me know if I can assist you in the future.   These are the goals we discussed: Goals    . Exercise 3x per week (30 min per time)    . Have 3 meals a day       This is a list of the screening recommended for you and due dates:  Health Maintenance  Topic Date Due  . Pneumococcal vaccine (1) 02/02/2017*  . Tetanus Vaccine  02/02/2017*  . Colon Cancer Screening  08/08/2033*  . Flu Shot  11/08/2016  . Complete foot exam   01/19/2017  . Hemoglobin A1C  02/02/2017  . Eye exam for diabetics  05/04/2017  . HIV Screening  Completed  *Topic was postponed. The date shown is not the original due date.

## 2016-08-15 NOTE — Progress Notes (Signed)
Subjective:   Brady Rios is a 51 y.o. male who presents for an Initial Medicare Annual Wellness Visit.  Review of Systems  Patient is a 51 year old male here today for his Medicare annual wellness visit.  The patient lives at home with his wife with his mother-in-law living close by.  They are all very close and enjoy spending time together.  He enjoys singing, going to church and hot wheel cars.  He exercise some weekly by doing strengthening exercises for his arms and legs and using some small weights.  He is unable to do more because he is confined to a wheelchair.  He eats most days 3 meals and does try to have some healthy snacks.  Some days he will only eat twice.  He and his wife attend [employer] where they are members and attend regularly.  Their pastor and other church members are very willing to help them because of his disability.  He has a small dog whose name is Tinkerbell who he reports he enjoys and is a lot of company to him. The patient feels that his health has improved since this time last year and feels much better.     Objective:    Today's Vitals   08/15/16 1539  BP: 129/75  Pulse: 85  Temp: 98.2 F (36.8 C)  TempSrc: Oral   There is no height or weight on file to calculate BMI.  Current Medications (verified) Outpatient Encounter Prescriptions as of 08/15/2016  Medication Sig  . amitriptyline (ELAVIL) 25 MG tablet Take 1 tablet (25 mg total) by mouth at bedtime.  Marland Kitchen. amLODipine (NORVASC) 2.5 MG tablet Take 1 tablet (2.5 mg total) by mouth daily.  Marland Kitchen. atorvastatin (LIPITOR) 10 MG tablet Take 1 tablet (10 mg total) by mouth daily.  . carvedilol (COREG) 25 MG tablet Take 1 tablet (25 mg total) by mouth 2 (two) times daily with a meal.  . famotidine (PEPCID) 20 MG tablet Take 1 tablet (20 mg total) by mouth 2 (two) times daily.  Marland Kitchen. glucose blood (ONETOUCH VERIO) test strip TEST 3 TIMES A DAY (E10.9)  . insulin aspart (NOVOLOG) 100 UNIT/ML injection  INJECT 12 UNITS INTO THE SKIN 3 TIMES DAILY BEFORE MEALS-SLIDING SCALE IF ABOVE 200-2 UNITS EVERY 50  . insulin detemir (LEVEMIR) 100 UNIT/ML injection Inject 0.3 mLs (30 Units total) into the skin at bedtime.  Marland Kitchen. LEVEMIR 100 UNIT/ML injection INJECT 0.3 MLS (30 UNITS TOTAL) INTO THE SKIN AT BEDTIME.  Marland Kitchen. lisinopril (PRINIVIL,ZESTRIL) 20 MG tablet Take 1 tablet (20 mg total) by mouth daily.  . montelukast (SINGULAIR) 10 MG tablet Take 1 tablet (10 mg total) by mouth at bedtime.  Letta Pate. ONETOUCH DELICA LANCETS 33G MISC 1 each by Does not apply route 4 (four) times daily.  . SYRINGE-NEEDLE, DISP, 3 ML (MONOJECT 3CC SYR 27GX1-1/4") 27G X 1-1/4" 3 ML MISC Use up to QID and as needed  . Syringe/Needle, Disp, (SYRINGE 3CC/25GX5/8") 25G X 5/8" 3 ML MISC 1 each by Does not apply route 4 (four) times daily.   No facility-administered encounter medications on file as of 08/15/2016.     Allergies (verified) Patient has no known allergies.   History: Past Medical History:  Diagnosis Date  . Allergy   . Diabetes mellitus without complication (HCC) 1997   type 1  . GERD (gastroesophageal reflux disease)   . Hyperlipidemia   . Hypertension   . Neuropathy   . Optic atrophy  Past Surgical History:  Procedure Laterality Date  . LEG SURGERY     as a child to try to correct legs   Family History  Problem Relation Age of Onset  . Alzheimer's disease Mother   . Hypertension Father   . Cancer Brother     gall bladder and liver  . Cancer Maternal Grandmother     cervical  . Cancer Maternal Grandfather     stomach  . Cancer Paternal Grandfather     colon   Social History   Occupational History  . Not on file.   Social History Main Topics  . Smoking status: Never Smoker  . Smokeless tobacco: Never Used  . Alcohol use No  . Drug use: No  . Sexual activity: Yes     Comment: with spouse since 2015   Tobacco Counseling Patient is a non-smoker and has never tried dip or snuff.    Activities of  Daily Living In your present state of health, do you have any difficulty performing the following activities: 08/15/2016  Hearing? N  Vision? Y  Difficulty concentrating or making decisions? N  Walking or climbing stairs? Y  Dressing or bathing? N  Doing errands, shopping? N  Some recent data might be hidden   Brady Rios has had partial blindness since birth and is confined to a wheelchair.  He reports having scans of his brain and spinal cord early in his life but they were unable to determine what was the cause for his blindness and inability to walk.    Immunizations and Health Maintenance It was  Discussed with the patient the needs for immunizations again influenza, pneumonia and tetanus.  We also talked about scheduling him for a colonoscopy.  The patient prefers to wait to be able to think about the vaccines and colonoscopy until his appointment in October with his PCP.  Patient Care Team: Dettinger, Elige Radon, MD as PCP - General (Family Medicine)  Indicate any recent Medical Services you may have received from other than [facility] providers in the past year (date may be approximate).    Assessment:   This is a routine wellness examination for Brady Rios.  Hearing/Vision screen Patient is partially blind.   His hearing is normal.Pa  Dietary issues and exercise activities discussed:    Goals    . Exercise 3x per week (30 min per time)    . Have 3 meals a day      Depression Screen PHQ 2/9 Scores 08/15/2016 08/03/2016 05/04/2016 01/20/2016  PHQ - 2 Score 0 0 0 0    Fall Risk Fall Risk  10/20/2015  Falls in the past year? No    Cognitive Function: MMSE - Mini Mental State Exam 08/15/2016 10/20/2015  Orientation to time 5 5  Orientation to Place 5 5  Registration 3 3  Attention/ Calculation 5 3  Recall 3 3  Language- name 2 objects 2 2  Language- repeat 1 1  Language- follow 3 step command 3 3  Language- read & follow direction 0 -  Write a sentence 0 -  Copy design 0  -  Total score 27 -    Patient performed well on the MMSE scoring a total of 27 out of 30 available points.  The patient was unable to perform the reading, writing or copying because of his partial blindness.    Screening Tests Health Maintenance  Topic Date Due  . PNEUMOCOCCAL POLYSACCHARIDE VACCINE (1) 02/02/2017 (Originally 06/01/1966)  . TETANUS/TDAP  02/02/2017 (Originally 06/02/1983)  . COLONOSCOPY  08/08/2033 (Originally 06/01/2014)  . INFLUENZA VACCINE  11/08/2016  . FOOT EXAM  01/19/2017  . HEMOGLOBIN A1C  02/02/2017  . OPHTHALMOLOGY EXAM  05/04/2017  . HIV Screening  Completed        Plan:    The patient has a followup appointment with his PCP, Dr. Louanne Skye, on 02/05/2017.  At that time, patient will need labwork, EKG, chest x-ray and a foot exam.  Tdap vaccine, flu vaccine and pneumonia vaccine.  We will need to discuss the need for his colonoscopy then also.     I have personally reviewed and noted the following in the patient's chart:   . Medical and social history . Use of alcohol, tobacco or illicit drugs  . Current medications and supplements . Functional ability and status . Nutritional status . Physical activity . Advanced directives . List of other physicians . Hospitalizations, surgeries, and ER visits in previous 12 months . Vitals . Screenings to include cognitive, depression, and falls . Referrals and appointments  In addition, I have reviewed and discussed with patient certain preventive protocols, quality metrics, and best practice recommendations. A written personalized care plan for preventive services as well as general preventive health recommendations were provided to patient.     Ulice Brilliant, LPN   04/15/1094   I have reviewed and agree with the above AWV documentation.   Jannifer Rodney, FNP

## 2016-09-24 DIAGNOSIS — R52 Pain, unspecified: Secondary | ICD-10-CM | POA: Diagnosis not present

## 2016-09-24 DIAGNOSIS — J029 Acute pharyngitis, unspecified: Secondary | ICD-10-CM | POA: Diagnosis not present

## 2016-10-10 ENCOUNTER — Other Ambulatory Visit: Payer: Self-pay | Admitting: Family Medicine

## 2016-10-10 DIAGNOSIS — E1065 Type 1 diabetes mellitus with hyperglycemia: Principal | ICD-10-CM

## 2016-10-10 DIAGNOSIS — IMO0001 Reserved for inherently not codable concepts without codable children: Secondary | ICD-10-CM

## 2016-10-10 MED ORDER — GLUCOSE BLOOD VI STRP: ORAL_STRIP | 11 refills | Status: DC

## 2016-10-10 MED ORDER — ONETOUCH DELICA LANCETS 33G MISC: 11 refills | Status: DC

## 2016-10-10 NOTE — Telephone Encounter (Signed)
Sent mail order

## 2016-10-24 ENCOUNTER — Telehealth: Payer: Self-pay | Admitting: Family Medicine

## 2016-10-24 MED ORDER — GLUCOSE BLOOD VI STRP: ORAL_STRIP | 3 refills | Status: DC

## 2016-10-24 NOTE — Telephone Encounter (Signed)
Test strips were sent to mail order for #100 === needed to be #300 - this was corrected and pt aware

## 2017-02-05 ENCOUNTER — Ambulatory Visit (INDEPENDENT_AMBULATORY_CARE_PROVIDER_SITE_OTHER): Payer: Medicare Other | Admitting: Family Medicine

## 2017-02-05 ENCOUNTER — Encounter: Payer: Self-pay | Admitting: Family Medicine

## 2017-02-05 VITALS — BP 137/80 | HR 101 | Temp 98.4°F

## 2017-02-05 DIAGNOSIS — E1069 Type 1 diabetes mellitus with other specified complication: Secondary | ICD-10-CM

## 2017-02-05 DIAGNOSIS — E1065 Type 1 diabetes mellitus with hyperglycemia: Secondary | ICD-10-CM

## 2017-02-05 DIAGNOSIS — E782 Mixed hyperlipidemia: Secondary | ICD-10-CM | POA: Diagnosis not present

## 2017-02-05 DIAGNOSIS — I10 Essential (primary) hypertension: Secondary | ICD-10-CM | POA: Diagnosis not present

## 2017-02-05 DIAGNOSIS — E1159 Type 2 diabetes mellitus with other circulatory complications: Secondary | ICD-10-CM

## 2017-02-05 LAB — BAYER DCA HB A1C WAIVED: HB A1C (BAYER DCA - WAIVED): 7.4 % — ABNORMAL HIGH (ref <7.0)

## 2017-02-05 NOTE — Progress Notes (Signed)
BP 137/80   Pulse (!) 101   Temp 98.4 F (36.9 C) (Oral)    Subjective:    Patient ID: Brady Rios, male    DOB: April 17, 1965, 51 y.o.   MRN: [id number]  HPI: Brady B. Lant is a 51 y.o. male presenting on 02/05/2017 for Diabetes (6 mo); Hyperlipidemia; and Hypertension   HPI Type 2 diabetes mellitus Patient comes in today for recheck of his diabetes. Patient has been currently taking Levemir and NovoLog. Patient is currently on an ACE inhibitor/ARB. Patient has not seen an ophthalmologist this year because he is blind already. Patient denies any issues with their feet.   Hyperlipidemia Patient is coming in for recheck of his hyperlipidemia. The patient is currently taking Lipitor. They deny any issues with myalgias or history of liver damage from it. They deny any focal numbness or weakness or chest pain.   Hypertension Patient is currently on amlodipine and carvedilol and lisinopril, and their blood pressure today is 137/80. Patient denies any lightheadedness or dizziness. Patient denies headaches, blurred vision, chest pains, shortness of breath, or weakness. Denies any side effects from medication and is content with current medication.   Relevant past medical, surgical, family and social history reviewed and updated as indicated. Interim medical history since our last visit reviewed. Allergies and medications reviewed and updated.  Review of Systems  Constitutional: Negative for chills and fever.  HENT: Negative for ear pain and tinnitus.   Eyes: Negative for pain and discharge.  Respiratory: Negative for cough, shortness of breath and wheezing.   Cardiovascular: Negative for chest pain, palpitations and leg swelling.  Gastrointestinal: Negative for abdominal pain, blood in stool, constipation and diarrhea.  Genitourinary: Negative for dysuria and hematuria.  Musculoskeletal: Negative for back pain, gait problem and myalgias.  Skin: Negative for rash.  Neurological:  Negative for dizziness, weakness and headaches.  Psychiatric/Behavioral: Negative for suicidal ideas.  All other systems reviewed and are negative.   Per HPI unless specifically indicated above        Objective:    BP 137/80   Pulse (!) 101   Temp 98.4 F (36.9 C) (Oral)   Wt Readings from Last 3 Encounters:  No data found for Wt    Physical Exam  Constitutional: He is oriented to person, place, and time. He appears well-developed and well-nourished. No distress.  HENT:  Right Ear: External ear normal.  Left Ear: External ear normal.  Nose: Nose normal.  Mouth/Throat: Oropharynx is clear and moist. No oropharyngeal exudate.  Eyes: Pupils are equal, round, and reactive to light. Conjunctivae and EOM are normal. Right eye exhibits no discharge. No scleral icterus.  Neck: Neck supple. No thyromegaly present.  Cardiovascular: Normal rate, regular rhythm, normal heart sounds and intact distal pulses.   No murmur heard. Pulmonary/Chest: Effort normal and breath sounds normal. No respiratory distress. He has no wheezes. He has no rales.  Musculoskeletal: Normal range of motion. He exhibits no edema.  Paralyzed from waist down and wheelchair-bound and has significant muscle wasting of lower extremities  Lymphadenopathy:    He has no cervical adenopathy.  Neurological: He is alert and oriented to person, place, and time. Coordination normal.  Skin: Skin is warm and dry. No rash noted. He is not diaphoretic.  Psychiatric: He has a normal mood and affect. His behavior is normal.  Vitals reviewed.  Diabetic Foot Exam - Simple   Simple Foot Form Diabetic Foot exam was performed with the following findings:  Yes  02/05/2017  4:03 PM  Visual Inspection No deformities, no ulcerations, no other skin breakdown bilaterally:  Yes Sensation Testing Intact to touch and monofilament testing bilaterally:  Yes Pulse Check Posterior Tibialis and Dorsalis pulse intact bilaterally:   Yes Comments     Results for orders placed or performed in visit on 08/03/16  Bayer DCA Hb A1c Waived  Result Value Ref Range   Bayer DCA Hb A1c Waived 6.1 <7.0 %      Assessment & Plan:   Problem List Items Addressed This Visit      Cardiovascular and Mediastinum   Hypertension associated with diabetes ([place])   Relevant Orders   CMP14+EGFR     Endocrine   Mixed diabetic hyperlipidemia associated with type 1 diabetes mellitus ([place])   Relevant Orders   Lipid panel   Diabetes type 1, uncontrolled ([place]) - Primary   Relevant Orders   Bayer DCA Hb A1c Waived   CMP14+EGFR       Follow up plan: Return in about 3 months (around 05/08/2017), or if symptoms worsen or fail to improve, for Recheck diabetes.  Counseling provided for all of the vaccine components Orders Placed This Encounter  Procedures  . Bayer DCA Hb A1c Waived  . CMP14+EGFR  . Lipid panel    Caryl Pina, MD [facility] 02/05/2017, 3:48 PM

## 2017-02-06 LAB — CMP14+EGFR
ALT: 11 IU/L (ref 0–44)
AST: 19 IU/L (ref 0–40)
Albumin/Globulin Ratio: 2.3 — ABNORMAL HIGH (ref 1.2–2.2)
Albumin: 4.5 g/dL (ref 3.5–5.5)
Alkaline Phosphatase: 100 IU/L (ref 39–117)
BUN/Creatinine Ratio: 19 (ref 9–20)
BUN: 14 mg/dL (ref 6–24)
Bilirubin Total: 0.7 mg/dL (ref 0.0–1.2)
CALCIUM: 8.7 mg/dL (ref 8.7–10.2)
CO2: 26 mmol/L (ref 20–29)
CREATININE: 0.75 mg/dL — AB (ref 0.76–1.27)
Chloride: 101 mmol/L (ref 96–106)
GFR calc Af Amer: 122 mL/min/{1.73_m2} (ref >59)
GFR, EST NON AFRICAN AMERICAN: 105 mL/min/{1.73_m2} (ref >59)
Globulin, Total: 2 g/dL (ref 1.5–4.5)
Glucose: 205 mg/dL — ABNORMAL HIGH (ref 65–99)
POTASSIUM: 4.2 mmol/L (ref 3.5–5.2)
Sodium: 141 mmol/L (ref 134–144)
Total Protein: 6.5 g/dL (ref 6.0–8.5)

## 2017-02-06 LAB — LIPID PANEL
CHOL/HDL RATIO: 2.4 ratio (ref 0.0–5.0)
Cholesterol, Total: 120 mg/dL (ref 100–199)
HDL: 49 mg/dL (ref >39)
LDL CALC: 39 mg/dL (ref 0–99)
TRIGLYCERIDES: 158 mg/dL — AB (ref 0–149)
VLDL Cholesterol Cal: 32 mg/dL (ref 5–40)

## 2017-02-07 ENCOUNTER — Telehealth: Payer: Self-pay | Admitting: Family Medicine

## 2017-02-20 ENCOUNTER — Telehealth: Payer: Self-pay | Admitting: Family Medicine

## 2017-02-20 NOTE — Telephone Encounter (Signed)
Patient aware of results, will bring blood sugar log to next appointment

## 2017-02-20 NOTE — Telephone Encounter (Signed)
Patient aware of lab results.

## 2017-04-25 ENCOUNTER — Other Ambulatory Visit: Payer: Self-pay | Admitting: *Deleted

## 2017-04-25 DIAGNOSIS — I10 Essential (primary) hypertension: Secondary | ICD-10-CM

## 2017-04-25 DIAGNOSIS — IMO0001 Reserved for inherently not codable concepts without codable children: Secondary | ICD-10-CM

## 2017-04-25 DIAGNOSIS — E1065 Type 1 diabetes mellitus with hyperglycemia: Secondary | ICD-10-CM

## 2017-04-25 DIAGNOSIS — E785 Hyperlipidemia, unspecified: Secondary | ICD-10-CM

## 2017-04-26 ENCOUNTER — Telehealth: Payer: Self-pay | Admitting: Family Medicine

## 2017-04-26 MED ORDER — ONETOUCH DELICA LANCETS 33G MISC: 3 refills | Status: DC

## 2017-04-26 MED ORDER — CARVEDILOL 25 MG PO TABS: 25.0000 mg | ORAL_TABLET | Freq: Two times a day (BID) | ORAL | 3 refills | Status: DC

## 2017-04-26 MED ORDER — LISINOPRIL 20 MG PO TABS: 20.0000 mg | ORAL_TABLET | Freq: Every day | ORAL | 3 refills | Status: DC

## 2017-04-26 MED ORDER — AMITRIPTYLINE HCL 25 MG PO TABS: 25.0000 mg | ORAL_TABLET | Freq: Every day | ORAL | 3 refills | Status: DC

## 2017-04-26 MED ORDER — MONTELUKAST SODIUM 10 MG PO TABS: 10.0000 mg | ORAL_TABLET | Freq: Every day | ORAL | 3 refills | Status: DC

## 2017-04-26 MED ORDER — GLUCOSE BLOOD VI STRP: ORAL_STRIP | 3 refills | Status: DC

## 2017-04-26 MED ORDER — INSULIN DETEMIR 100 UNIT/ML ~~LOC~~ SOLN: 30.0000 [IU] | Freq: Every day | SUBCUTANEOUS | 2 refills | Status: DC

## 2017-04-26 MED ORDER — FAMOTIDINE 20 MG PO TABS: 20.0000 mg | ORAL_TABLET | Freq: Two times a day (BID) | ORAL | 3 refills | Status: DC

## 2017-04-26 MED ORDER — ATORVASTATIN CALCIUM 10 MG PO TABS: 10.0000 mg | ORAL_TABLET | Freq: Every day | ORAL | 3 refills | Status: DC

## 2017-04-26 MED ORDER — INSULIN ASPART 100 UNIT/ML ~~LOC~~ SOLN: SUBCUTANEOUS | 3 refills | Status: DC

## 2017-04-26 MED ORDER — AMLODIPINE BESYLATE 2.5 MG PO TABS: 2.5000 mg | ORAL_TABLET | Freq: Every day | ORAL | 3 refills | Status: DC

## 2017-04-26 MED ORDER — "SYRINGE 25G X 5/8"" 3 ML MISC": 1.0000 | Freq: Four times a day (QID) | 3 refills | Status: DC

## 2017-04-26 MED ORDER — INSULIN DETEMIR 100 UNIT/ML ~~LOC~~ SOLN: 30.0000 [IU] | Freq: Every day | SUBCUTANEOUS | 3 refills | Status: DC

## 2017-04-26 MED ORDER — "SYRINGE/NEEDLE (DISP) 27G X 1-1/4"" 3 ML MISC": 3 refills | Status: DC

## 2017-04-26 NOTE — Telephone Encounter (Signed)
wrf °

## 2017-04-26 NOTE — Telephone Encounter (Signed)
Closing encounter.  Addressed in another encounter.

## 2017-05-21 ENCOUNTER — Encounter: Payer: Self-pay | Admitting: Family Medicine

## 2017-05-21 ENCOUNTER — Ambulatory Visit (INDEPENDENT_AMBULATORY_CARE_PROVIDER_SITE_OTHER): Payer: Medicare Other | Admitting: Family Medicine

## 2017-05-21 VITALS — BP 132/76 | HR 88 | Temp 98.5°F

## 2017-05-21 DIAGNOSIS — E1069 Type 1 diabetes mellitus with other specified complication: Secondary | ICD-10-CM

## 2017-05-21 DIAGNOSIS — E782 Mixed hyperlipidemia: Secondary | ICD-10-CM

## 2017-05-21 DIAGNOSIS — I152 Hypertension secondary to endocrine disorders: Secondary | ICD-10-CM

## 2017-05-21 DIAGNOSIS — E1065 Type 1 diabetes mellitus with hyperglycemia: Secondary | ICD-10-CM | POA: Diagnosis not present

## 2017-05-21 DIAGNOSIS — E1159 Type 2 diabetes mellitus with other circulatory complications: Secondary | ICD-10-CM | POA: Diagnosis not present

## 2017-05-21 DIAGNOSIS — I10 Essential (primary) hypertension: Secondary | ICD-10-CM

## 2017-05-21 LAB — BAYER DCA HB A1C WAIVED: HB A1C (BAYER DCA - WAIVED): 7 % — ABNORMAL HIGH (ref <7.0)

## 2017-05-21 NOTE — Progress Notes (Signed)
BP 132/76   Pulse 88   Temp 98.5 F (36.9 C) (Oral)    Subjective:    Patient ID: Brady Rios, male    DOB: 08-Jun-1965, 52 y.o.   MRN: [id number]  HPI: Brady Rios is a 52 y.o. male presenting on 05/21/2017 for Diabetes and Hypertension   HPI Type 2 diabetes mellitus Patient comes in today for recheck of his diabetes. Patient has been currently taking NovoLog insulin 12 units 3 times daily before meals and Levemir 30 units daily. Patient is currently on an ACE inhibitor/ARB. Patient has not seen an ophthalmologist this year because patient is legally blind and does not need to see an ophthalmologist. Patient denies any new issues with their feet but patient is paralyzed below the waist and has no sensation below the waist.  Patient is coming in today because his blood sugar was running up last week and was as high as 392.  Patient does admit that he had a sinus infection and pressure that was going on last week.  Today he is taking his blood sugar and is back down to 132 where it has been previously and has been doing well for him.  Hypertension Patient is currently on lisinopril and Coreg and amlodipine, and their blood pressure today is 132/76. Patient denies any lightheadedness or dizziness. Patient denies headaches, blurred vision, chest pains, shortness of breath, or weakness. Denies any side effects from medication and is content with current medication.   Hyperlipidemia Patient is coming in for recheck of his hyperlipidemia. The patient is currently taking no medication currently. They deny any issues with myalgias or history of liver damage from it. They deny any focal numbness or weakness or chest pain.   Relevant past medical, surgical, family and social history reviewed and updated as indicated. Interim medical history since our last visit reviewed. Allergies and medications reviewed and updated.  Review of Systems  Constitutional: Negative for chills and fever.    Respiratory: Negative for shortness of breath and wheezing.   Cardiovascular: Negative for chest pain and leg swelling.  Musculoskeletal: Negative for back pain and gait problem.  Skin: Negative for rash.  Neurological: Negative for dizziness, weakness and light-headedness.  All other systems reviewed and are negative.   Per HPI unless specifically indicated above   Allergies as of 05/21/2017   No Known Allergies     Medication List        Accurate as of 05/21/17  4:14 PM. Always use your most recent med list.          amitriptyline 25 MG tablet Commonly known as:  ELAVIL Take 1 tablet (25 mg total) by mouth at bedtime.   amLODipine 2.5 MG tablet Commonly known as:  NORVASC Take 1 tablet (2.5 mg total) by mouth daily.   atorvastatin 10 MG tablet Commonly known as:  LIPITOR Take 1 tablet (10 mg total) by mouth daily.   carvedilol 25 MG tablet Commonly known as:  COREG Take 1 tablet (25 mg total) by mouth 2 (two) times daily with a meal.   famotidine 20 MG tablet Commonly known as:  PEPCID Take 1 tablet (20 mg total) by mouth 2 (two) times daily.   glucose blood test strip Commonly known as:  ONETOUCH VERIO TEST QID Dx E10.9   insulin aspart 100 UNIT/ML injection Commonly known as:  NOVOLOG INJECT 12 UNITS INTO THE SKIN 3 TIMES DAILY BEFORE MEALS-SLIDING SCALE IF ABOVE 200-2 UNITS EVERY 50   insulin  detemir 100 UNIT/ML injection Commonly known as:  LEVEMIR Inject 0.3 mLs (30 Units total) into the skin at bedtime.   insulin detemir 100 UNIT/ML injection Commonly known as:  LEVEMIR Inject 0.3 mLs (30 Units total) into the skin at bedtime.   lisinopril 20 MG tablet Commonly known as:  PRINIVIL,ZESTRIL Take 1 tablet (20 mg total) by mouth daily.   montelukast 10 MG tablet Commonly known as:  SINGULAIR Take 1 tablet (10 mg total) by mouth at bedtime.   ONETOUCH DELICA LANCETS 65L Misc Test BS QID and PRN dx E11.9   SYRINGE 3CC/25GX5/8" 25G X 5/8" 3 ML  Misc 1 each by Does not apply route 4 (four) times daily.   SYRINGE-NEEDLE (DISP) 3 ML 27G X 1-1/4" 3 ML Misc Commonly known as:  MONOJECT 3CC SYR 27GX1-1/4" Use up to QID and as needed          Objective:    BP 132/76   Pulse 88   Temp 98.5 F (36.9 C) (Oral)   Wt Readings from Last 3 Encounters:  No data found for Wt    Physical Exam  Constitutional: He is oriented to person, place, and time. He appears well-developed and well-nourished. No distress.  Eyes: Conjunctivae are normal. No scleral icterus.  Neck: Neck supple. No thyromegaly present.  Cardiovascular: Normal rate, regular rhythm, normal heart sounds and intact distal pulses.  No murmur heard. Pulmonary/Chest: Effort normal and breath sounds normal. No respiratory distress. He has no wheezes.  Musculoskeletal: Normal range of motion. He exhibits no edema.  Lymphadenopathy:    He has no cervical adenopathy.  Neurological: He is alert and oriented to person, place, and time. Coordination normal.  Skin: Skin is warm and dry. No rash noted. He is not diaphoretic.  Psychiatric: He has a normal mood and affect. His behavior is normal.  Nursing note and vitals reviewed.   Results for orders placed or performed in visit on 02/05/17  Bayer DCA Hb A1c Waived  Result Value Ref Range   Bayer DCA Hb A1c Waived 7.4 (H) <7.0 %  CMP14+EGFR  Result Value Ref Range   Glucose 205 (H) 65 - 99 mg/dL   BUN 14 6 - 24 mg/dL   Creatinine, Ser 0.75 (L) 0.76 - 1.27 mg/dL   GFR calc non Af Amer 105 >59 mL/min/1.73   GFR calc Af Amer 122 >59 mL/min/1.73   BUN/Creatinine Ratio 19 9 - 20   Sodium 141 134 - 144 mmol/L   Potassium 4.2 3.5 - 5.2 mmol/L   Chloride 101 96 - 106 mmol/L   CO2 26 20 - 29 mmol/L   Calcium 8.7 8.7 - 10.2 mg/dL   Total Protein 6.5 6.0 - 8.5 g/dL   Albumin 4.5 3.5 - 5.5 g/dL   Globulin, Total 2.0 1.5 - 4.5 g/dL   Albumin/Globulin Ratio 2.3 (H) 1.2 - 2.2   Bilirubin Total 0.7 0.0 - 1.2 mg/dL   Alkaline  Phosphatase 100 39 - 117 IU/L   AST 19 0 - 40 IU/L   ALT 11 0 - 44 IU/L  Lipid panel  Result Value Ref Range   Cholesterol, Total 120 100 - 199 mg/dL   Triglycerides 158 (H) 0 - 149 mg/dL   HDL 49 >39 mg/dL   VLDL Cholesterol Cal 32 5 - 40 mg/dL   LDL Calculated 39 0 - 99 mg/dL   Chol/HDL Ratio 2.4 0.0 - 5.0 ratio      Assessment & Plan:   Problem List  Items Addressed This Visit      Cardiovascular and Mediastinum   Hypertension associated with diabetes ([place])     Endocrine   Mixed diabetic hyperlipidemia associated with type 1 diabetes mellitus ([place])   Diabetes type 1, uncontrolled ([place]) - Primary   Relevant Orders   Bayer DCA Hb A1c Waived       Follow up plan: Return in about 6 months (around 11/18/2017), or if symptoms worsen or fail to improve, for Recheck diabetes.  Counseling provided for all of the vaccine components Orders Placed This Encounter  Procedures  . [facility] Hb A1c [place], Brady Rios [facility] 05/21/2017, 4:14 PM

## 2017-08-07 ENCOUNTER — Ambulatory Visit: Payer: Medicare Other | Admitting: Family Medicine

## 2017-08-20 ENCOUNTER — Encounter: Payer: Self-pay | Admitting: *Deleted

## 2017-08-20 ENCOUNTER — Ambulatory Visit (INDEPENDENT_AMBULATORY_CARE_PROVIDER_SITE_OTHER): Payer: Medicare Other | Admitting: *Deleted

## 2017-08-20 VITALS — BP 149/87 | HR 89

## 2017-08-20 DIAGNOSIS — Z0000 Encounter for general adult medical examination without abnormal findings: Secondary | ICD-10-CM

## 2017-08-20 NOTE — Progress Notes (Addendum)
Subjective:   Brady Rios is a 52 y.o. male who presents for a [employer] Visit. Brady Rios lives at home with his wife who accompanies him today.   Review of Systems    Patient reports that her health is unchanged compared to last year.  Cardiac Risk Factors include: diabetes mellitus;dyslipidemia;male gender;hypertension;sedentary lifestyle  Psych: Does not feel sad or down but doesn't really feel like doing activities that he normally enjoys  Other systems negative unless otherwise noted in other area      Current Medications (verified) Outpatient Encounter Medications as of 08/20/2017  Medication Sig  . amitriptyline (ELAVIL) 25 MG tablet Take 1 tablet (25 mg total) by mouth at bedtime.  Marland Kitchen amLODipine (NORVASC) 2.5 MG tablet Take 1 tablet (2.5 mg total) by mouth daily.  Marland Kitchen atorvastatin (LIPITOR) 10 MG tablet Take 1 tablet (10 mg total) by mouth daily.  . carvedilol (COREG) 25 MG tablet Take 1 tablet (25 mg total) by mouth 2 (two) times daily with a meal.  . famotidine (PEPCID) 20 MG tablet Take 1 tablet (20 mg total) by mouth 2 (two) times daily.  Marland Kitchen glucose blood (ONETOUCH VERIO) test strip TEST QID Dx E10.9  . insulin aspart (NOVOLOG) 100 UNIT/ML injection INJECT 12 UNITS INTO THE SKIN 3 TIMES DAILY BEFORE MEALS-SLIDING SCALE IF ABOVE 200-2 UNITS EVERY 50  . insulin detemir (LEVEMIR) 100 UNIT/ML injection Inject 0.3 mLs (30 Units total) into the skin at bedtime.  . insulin detemir (LEVEMIR) 100 UNIT/ML injection Inject 0.3 mLs (30 Units total) into the skin at bedtime.  Marland Kitchen lisinopril (PRINIVIL,ZESTRIL) 20 MG tablet Take 1 tablet (20 mg total) by mouth daily.  . montelukast (SINGULAIR) 10 MG tablet Take 1 tablet (10 mg total) by mouth at bedtime.  Letta Pate DELICA LANCETS 33G MISC Test BS QID and PRN dx E11.9  . SYRINGE-NEEDLE, DISP, 3 ML (MONOJECT 3CC SYR 27GX1-1/4") 27G X 1-1/4" 3 ML MISC Use up to QID and as needed  . Syringe/Needle, Disp, (SYRINGE  3CC/25GX5/8") 25G X 5/8" 3 ML MISC 1 each by Does not apply route 4 (four) times daily.   No facility-administered encounter medications on file as of 08/20/2017.     Allergies (verified) Patient has no known allergies.   History: Past Medical History:  Diagnosis Date  . Allergy   . Diabetes mellitus without complication (HCC) 1997   type 1  . GERD (gastroesophageal reflux disease)   . Hyperlipidemia   . Hypertension   . Neuropathy   . Optic atrophy   . Paralysis (HCC)    from a viral infection in young child hood. has some sensation in lower extremity but unable to stand or walk   Past Surgical History:  Procedure Laterality Date  . LEG SURGERY     as a child to try to correct legs   Family History  Problem Relation Age of Onset  . Alzheimer's disease Mother   . Hypertension Father   . Pneumonia Father   . Cancer Brother        gall bladder and liver  . Cancer Maternal Grandmother        cervical  . Cancer Maternal Grandfather        stomach  . Cancer Paternal Grandfather        colon   Social History   Socioeconomic History  . Marital status: Married    Spouse name: Not on file  . Number of children: Not on file  .  Years of education: 5  . Highest education level: Associate degree: academic program  Occupational History  . Occupation: Disabled  Social Needs  . Financial resource strain: Not hard at all  . Food insecurity:    Worry: Never true    Inability: Never true  . Transportation needs:    Medical: No    Non-medical: No  Tobacco Use  . Smoking status: Never Smoker  . Smokeless tobacco: Never Used  Substance and Sexual Activity  . Alcohol use: No  . Drug use: No  . Sexual activity: Yes    Comment: with spouse since 2015  Lifestyle  . Physical activity:    Days per week: 0 days    Minutes per session: 0 min  . Stress: Only a little  Relationships  . Social connections:    Talks on phone: More than three times a week    Gets together:  More than three times a week    Attends religious service: More than 4 times per year    Active member of club or organization: Yes    Attends meetings of clubs or organizations: More than 4 times per year    Relationship status: Married  Other Topics Concern  . Not on file  Social History Narrative  . Not on file    Tobacco Counseling Counseling given: Not Answered   Clinical Intake:     Pain : No/denies pain     Nutritional Status: BMI of 19-24  Normal Diabetes: Yes CBG done?: No Did pt. bring in CBG monitor from home?: No  How often do you need to have someone help you when you read instructions, pamphlets, or other written materials from your doctor or pharmacy?: 5 - Always What is the last grade level you completed in school?: Associates degree in business administration     Information entered by :: Demetrios Loll, RN  Activities of Daily Living In your present state of health, do you have any difficulty performing the following activities: 08/20/2017  Hearing? N  Vision? Y  Comment legally blind  Difficulty concentrating or making decisions? N  Walking or climbing stairs? Y  Comment wheelchair bound  Dressing or bathing? N  Doing errands, shopping? Y  Comment wife helps with errands  Preparing Food and eating ? N  Using the Toilet? N  In the past six months, have you accidently leaked urine? N  Do you have problems with loss of bowel control? N  Managing your Medications? N  Managing your Finances? Y  Comment wife takes care of finances  Housekeeping or managing your Housekeeping? Y  Some recent data might be hidden     Immunizations and Health Maintenance  There is no immunization history on file for this patient. There are no preventive care reminders to display for this patient.  Diet Not addressed  Exercise Current Exercise Habits: The patient does not participate in regular exercise at present, Exercise limited by: orthopedic  condition(s);neurologic condition(s)    Depression Screen PHQ 2/9 Scores 05/21/2017 02/05/2017 08/15/2016 08/03/2016  PHQ - 2 Score 0 0 0 0     Fall Risk Fall Risk  10/20/2015  Falls in the past year? No    Safety Is the patient's home free of loose throw rugs in walkways, pet beds, electrical cords, etc?   yes      Grab bars in the bathroom? yes      Walkin shower? no      Shower Seat? yes  Handrails on the stairs?   yes      Adequate lighting?   yes  Patient Care Team: Dettinger, Elige Radon, MD as PCP - General (Family Medicine)  No hospitalizations, ER visits, or surgeries this past year.  Objective:    Today's Vitals   08/20/17 1527  BP: (!) 149/87  Pulse: 89   There is no height or weight on file to calculate BMI.  Advanced Directives 08/15/2016  Does Patient Have a Medical Advance Directive? No  Would patient like information on creating a medical advance directive? No - Patient declined    Hearing/Vision  Normal hearing but patient is legally blind   Cognitive Function: MMSE - Mini Mental State Exam 08/20/2017 08/15/2016 10/20/2015  Orientation to time Orientation to Place Registration Attention/ Calculation Recall Language- name 2 objects Language- repeat Language- follow 3 step command Language- read & follow direction 0 0 -  Language-read & follow direction-comments unable to read due to loss of sight - -  Write a sentence 0 0 -  Copy design 0 0 -  Total score 27 27 -       Normal Cognitive Function Screening: Yes      Assessment:   This is a routine wellness examination for Brady.      Plan:    Goals    . Exercise 3x per week (30 min per time)       Keep f/u with Dettinger, Elige Radon, MD and any other specialty appointments you may have Continue current medications Move carefully to avoid falls. Use assistive devices like a can or walker if needed. Aim for at least 150 minutes of  moderate activity a week. This can be chair exercises if necessary. Reading or puzzles are a good way to exercise your brain Stay connected with friends and family. Social connections are beneficial to your emotional and mental health.   Review and return a signed, witnessed, and notarized copy of Advance Directives if given.  Health Maintenance: Tdap Vaccine recommended: yes Zostavax (Shingles vaccine) recommended:yes Prevnar or Pneumovax (pneumonia vaccines) recommended:no  Cancer Screenings: Lung: Low Dose CT Chest recommended if Age 4-80 years, 30 pack-year currently smoking OR have quit w/in 15years. Patient does not qualify. Colon cancer screening recommended: yes  Additional Screenings Hepatitis C Screening recommended: not applicable Dexa Scan recommended: not applicable Diabetic Eye Exam recommended: Possibly  No orders of the defined types were placed in this encounter.   I have personally reviewed and noted the following in the patient's chart:   . Medical and social history . Use of alcohol, tobacco or illicit drugs  . Current medications and supplements . Functional ability and status . Nutritional status . Physical activity . Advanced directives . List of other physicians . Hospitalizations, surgeries, and ER visits in previous 12 months . Vitals . Screenings to include cognitive, depression, and falls . Referrals and appointments  In addition, I have reviewed and discussed with patient certain preventive protocols, quality metrics, and best practice recommendations. A written personalized care plan for preventive services as well as general preventive health recommendations were provided to patient.     Demetrios Loll, RN   08/20/2017

## 2017-08-20 NOTE — Patient Instructions (Signed)
  Brady Rios , Thank you for taking time to come for your Medicare Wellness Visit. I appreciate your ongoing commitment to your health goals. Please review the following plan we discussed and let me know if I can assist you in the future.   These are the goals we discussed: Goals    . Exercise 3x per week (30 min per time)       This is a list of the screening recommended for you and due dates:  Health Maintenance  Topic Date Due  . Colon Cancer Screening  08/08/2033*  . Flu Shot  11/08/2017  . Hemoglobin A1C  11/18/2017  . Complete foot exam   02/05/2018  . HIV Screening  Completed  . Pneumococcal vaccine  Discontinued  . Eye exam for diabetics  Discontinued  . Tetanus Vaccine  Discontinued  *Topic was postponed. The date shown is not the original due date.

## 2017-11-19 ENCOUNTER — Ambulatory Visit (INDEPENDENT_AMBULATORY_CARE_PROVIDER_SITE_OTHER): Payer: Medicare Other | Admitting: Family Medicine

## 2017-11-19 ENCOUNTER — Encounter: Payer: Self-pay | Admitting: Family Medicine

## 2017-11-19 VITALS — BP 153/84 | HR 85 | Temp 98.5°F

## 2017-11-19 DIAGNOSIS — I10 Essential (primary) hypertension: Secondary | ICD-10-CM

## 2017-11-19 DIAGNOSIS — E782 Mixed hyperlipidemia: Secondary | ICD-10-CM

## 2017-11-19 DIAGNOSIS — E1159 Type 2 diabetes mellitus with other circulatory complications: Secondary | ICD-10-CM | POA: Diagnosis not present

## 2017-11-19 DIAGNOSIS — H547 Unspecified visual loss: Secondary | ICD-10-CM | POA: Diagnosis not present

## 2017-11-19 DIAGNOSIS — E1069 Type 1 diabetes mellitus with other specified complication: Secondary | ICD-10-CM

## 2017-11-19 DIAGNOSIS — E1039 Type 1 diabetes mellitus with other diabetic ophthalmic complication: Secondary | ICD-10-CM

## 2017-11-19 MED ORDER — FAMOTIDINE 20 MG PO TABS: 20.0000 mg | ORAL_TABLET | Freq: Two times a day (BID) | ORAL | 3 refills | Status: DC | PRN

## 2017-11-19 NOTE — Progress Notes (Signed)
BP (!) 153/84   Pulse 85   Temp 98.5 F (36.9 C) (Oral)    Subjective:    Patient ID: Brady B. Welcome, male    DOB: [birth year], 52 y.o.   MRN: [id number]  HPI: Brady B. Achterberg is a 52 y.o. male presenting on 11/19/2017 for Diabetes (6 month follow up) and Hypertension   HPI Type 1 diabetes mellitus Patient comes in today for recheck of his diabetes. Patient has been currently taking Levemir 30 units nightly and NovoLog 12 3 times daily, he says he gets the occasional eat something bad where he will go up over 2 or 300 most the time he runs around 180 or 150.Marland Kitchen Patient is currently on an ACE inhibitor/ARB. Patient has not seen an ophthalmologist this year because patient is legally blind already. Patient denies any issues with their feet, patient is paralyzed below the waist and has very little sensation but they do keep a close eye on his feet.   Hypertension Patient is currently on carvedilol and lisinopril, and their blood pressure today is 153/84. Patient denies any lightheadedness or dizziness. Patient denies headaches, blurred vision, chest pains, shortness of breath, or weakness. Denies any side effects from medication and is content with current medication.   Hyperlipidemia Patient is coming in for recheck of his hyperlipidemia. The patient is currently taking Lipitor. They deny any issues with myalgias or history of liver damage from it. They deny any focal numbness or weakness or chest pain.   Relevant past medical, surgical, family and social history reviewed and updated as indicated. Interim medical history since our last visit reviewed. Allergies and medications reviewed and updated.  Review of Systems  Constitutional: Negative for chills and fever.  Eyes: Positive for visual disturbance (Patient is legally blind and has been for some time).  Respiratory: Negative for shortness of breath and wheezing.   Cardiovascular: Negative for chest pain and leg swelling.    Musculoskeletal: Negative for back pain and gait problem.  Skin: Negative for rash.  Neurological: Positive for weakness (No new weakness, patient is paralyzed below the waist). Negative for dizziness and light-headedness.  All other systems reviewed and are negative.   Per HPI unless specifically indicated above   Allergies as of 11/19/2017   No Known Allergies     Medication List        Accurate as of 11/19/17  3:43 PM. Always use your most recent med list.          amitriptyline 25 MG tablet Commonly known as:  ELAVIL Take 1 tablet (25 mg total) by mouth at bedtime.   amLODipine 2.5 MG tablet Commonly known as:  NORVASC Take 1 tablet (2.5 mg total) by mouth daily.   atorvastatin 10 MG tablet Commonly known as:  LIPITOR Take 1 tablet (10 mg total) by mouth daily.   carvedilol 25 MG tablet Commonly known as:  COREG Take 1 tablet (25 mg total) by mouth 2 (two) times daily with a meal.   famotidine 20 MG tablet Commonly known as:  PEPCID Take 1 tablet (20 mg total) by mouth 2 (two) times daily.   glucose blood test strip TEST QID Dx E10.9   insulin aspart 100 UNIT/ML injection Commonly known as:  novoLOG INJECT 12 UNITS INTO THE SKIN 3 TIMES DAILY BEFORE MEALS-SLIDING SCALE IF ABOVE 200-2 UNITS EVERY 50   insulin detemir 100 UNIT/ML injection Commonly known as:  LEVEMIR Inject 0.3 mLs (30 Units total) into the skin at bedtime.  lisinopril 20 MG tablet Commonly known as:  PRINIVIL,ZESTRIL Take 1 tablet (20 mg total) by mouth daily.   montelukast 10 MG tablet Commonly known as:  SINGULAIR Take 1 tablet (10 mg total) by mouth at bedtime.   ONETOUCH DELICA LANCETS 87F Misc Test BS QID and PRN dx E11.9   SYRINGE 3CC/25GX5/8" 25G X 5/8" 3 ML Misc 1 each by Does not apply route 4 (four) times daily.   SYRINGE-NEEDLE (DISP) 3 ML 27G X 1-1/4" 3 ML Misc Use up to QID and as needed          Objective:    BP (!) 182/89   Pulse 85   Temp 98.5 F (36.9  C) (Oral)   Wt Readings from Last 3 Encounters:  No data found for Wt    Physical Exam  Constitutional: He is oriented to person, place, and time. He appears well-developed and well-nourished. No distress.  Eyes: Conjunctivae are normal. No scleral icterus.  Neck: Neck supple. No thyromegaly present.  Cardiovascular: Normal rate, regular rhythm, normal heart sounds and intact distal pulses.  No murmur heard. Pulmonary/Chest: Effort normal and breath sounds normal. No respiratory distress. He has no wheezes.  Musculoskeletal: Normal range of motion. He exhibits no edema.  Lymphadenopathy:    He has no cervical adenopathy.  Neurological: He is alert and oriented to person, place, and time. He exhibits abnormal muscle tone (Patient paralyzed below the waist, has been like this for some time.). Coordination normal.  Skin: Skin is warm and dry. No rash noted. He is not diaphoretic.  Psychiatric: He has a normal mood and affect. His behavior is normal.  Nursing note and vitals reviewed.       Assessment & Plan:   Problem List Items Addressed This Visit      Cardiovascular and Mediastinum   Hypertension associated with diabetes ([place])   Relevant Orders   CMP14+EGFR     Endocrine   Mixed diabetic hyperlipidemia associated with type 1 diabetes mellitus ([place]) - Primary   Relevant Orders   Bayer DCA Hb A1c Waived   Lipid panel   Controlled type 1 diabetes mellitus ([place])   Relevant Orders   Bayer DCA Hb A1c Waived     Other   Blindness       Follow up plan: Return in about 3 months (around 02/19/2018), or if symptoms worsen or fail to improve, for Diabetes and hypertension.  Counseling provided for all of the vaccine components Orders Placed This Encounter  Procedures  . Bayer DCA Hb A1c Waived  . CMP14+EGFR  . Lipid panel    Caryl Pina, MD [facility] 11/19/2017, 3:43 PM

## 2017-11-20 LAB — LIPID PANEL
CHOL/HDL RATIO: 2.4 ratio (ref 0.0–5.0)
Cholesterol, Total: 114 mg/dL (ref 100–199)
HDL: 48 mg/dL (ref >39)
LDL CALC: 44 mg/dL (ref 0–99)
TRIGLYCERIDES: 108 mg/dL (ref 0–149)
VLDL Cholesterol Cal: 22 mg/dL (ref 5–40)

## 2017-11-20 LAB — CMP14+EGFR
ALBUMIN: 4.3 g/dL (ref 3.5–5.5)
ALT: 15 IU/L (ref 0–44)
AST: 19 IU/L (ref 0–40)
Albumin/Globulin Ratio: 2.2 (ref 1.2–2.2)
Alkaline Phosphatase: 95 IU/L (ref 39–117)
BUN/Creatinine Ratio: 14 (ref 9–20)
BUN: 8 mg/dL (ref 6–24)
Bilirubin Total: 0.8 mg/dL (ref 0.0–1.2)
CALCIUM: 8.5 mg/dL — AB (ref 8.7–10.2)
CO2: 24 mmol/L (ref 20–29)
CREATININE: 0.58 mg/dL — AB (ref 0.76–1.27)
Chloride: 99 mmol/L (ref 96–106)
GFR, EST AFRICAN AMERICAN: 135 mL/min/{1.73_m2} (ref >59)
GFR, EST NON AFRICAN AMERICAN: 116 mL/min/{1.73_m2} (ref >59)
GLOBULIN, TOTAL: 2 g/dL (ref 1.5–4.5)
Glucose: 103 mg/dL — ABNORMAL HIGH (ref 65–99)
POTASSIUM: 4 mmol/L (ref 3.5–5.2)
SODIUM: 139 mmol/L (ref 134–144)
TOTAL PROTEIN: 6.3 g/dL (ref 6.0–8.5)

## 2017-11-20 LAB — BAYER DCA HB A1C WAIVED: HB A1C (BAYER DCA - WAIVED): 6.7 % (ref <7.0)

## 2018-01-22 ENCOUNTER — Encounter: Payer: Self-pay | Admitting: *Deleted

## 2018-03-01 ENCOUNTER — Ambulatory Visit: Payer: Medicare Other | Admitting: Family Medicine

## 2018-03-04 ENCOUNTER — Encounter: Payer: Self-pay | Admitting: Family Medicine

## 2018-03-04 ENCOUNTER — Ambulatory Visit (INDEPENDENT_AMBULATORY_CARE_PROVIDER_SITE_OTHER): Payer: Medicare Other | Admitting: Family Medicine

## 2018-03-04 VITALS — BP 162/85 | HR 92 | Temp 98.2°F

## 2018-03-04 DIAGNOSIS — I152 Hypertension secondary to endocrine disorders: Secondary | ICD-10-CM

## 2018-03-04 DIAGNOSIS — E1159 Type 2 diabetes mellitus with other circulatory complications: Secondary | ICD-10-CM | POA: Diagnosis not present

## 2018-03-04 DIAGNOSIS — E1069 Type 1 diabetes mellitus with other specified complication: Secondary | ICD-10-CM

## 2018-03-04 DIAGNOSIS — I10 Essential (primary) hypertension: Secondary | ICD-10-CM | POA: Diagnosis not present

## 2018-03-04 DIAGNOSIS — H543 Unqualified visual loss, both eyes: Secondary | ICD-10-CM | POA: Diagnosis not present

## 2018-03-04 DIAGNOSIS — E1039 Type 1 diabetes mellitus with other diabetic ophthalmic complication: Secondary | ICD-10-CM

## 2018-03-04 DIAGNOSIS — E782 Mixed hyperlipidemia: Secondary | ICD-10-CM

## 2018-03-04 LAB — BAYER DCA HB A1C WAIVED: HB A1C (BAYER DCA - WAIVED): 7.1 % — ABNORMAL HIGH (ref <7.0)

## 2018-03-04 MED ORDER — AMLODIPINE BESYLATE 5 MG PO TABS: 5.0000 mg | ORAL_TABLET | Freq: Every day | ORAL | 3 refills | Status: DC

## 2018-03-04 NOTE — Progress Notes (Signed)
BP (!) 162/85   Pulse 92   Temp 98.2 F (36.8 C) (Oral)    Subjective:    Patient ID: Brady Rios, male    DOB: [birth year], 52 y.o.   MRN: [id number]  HPI: Brady Rios is a 52 y.o. male presenting on 03/04/2018 for Diabetes (3 month follow up); Hypertension; and Hyperlipidemia   HPI Type 1 diabetes mellitus Patient comes in today for recheck of his diabetes. Patient has been currently taking insulin NovoLog 12 units 3 times daily with meals and Levemir 30 units nightly. Patient is currently on an ACE inhibitor/ARB. Patient has not seen an ophthalmologist this year because he is legally blind already. Patient denies any issues with their feet.  Patient is blind  Hypertension Patient is currently on amlodipine and carvedilol and lisinopril, and their blood pressure today is 162/85. Patient denies any lightheadedness or dizziness. Patient denies headaches, blurred vision, chest pains, shortness of breath, or weakness. Denies any side effects from medication and is content with current medication.   Hyperlipidemia Patient is coming in for recheck of his hyperlipidemia. The patient is currently taking atorvastatin. They deny any issues with myalgias or history of liver damage from it. They deny any focal numbness or weakness or chest pain.   Relevant past medical, surgical, family and social history reviewed and updated as indicated. Interim medical history since our last visit reviewed. Allergies and medications reviewed and updated.  Review of Systems  Constitutional: Negative for chills and fever.  Respiratory: Negative for shortness of breath and wheezing.   Cardiovascular: Negative for chest pain and leg swelling.  Musculoskeletal: Negative for back pain and gait problem.  Skin: Negative for rash.  Neurological: Negative for dizziness, numbness and headaches.  All other systems reviewed and are negative.   Per HPI unless specifically indicated above   Allergies as of  03/04/2018   No Known Allergies     Medication List        Accurate as of 03/04/18  4:17 PM. Always use your most recent med list.          amitriptyline 25 MG tablet Commonly known as:  ELAVIL Take 1 tablet (25 mg total) by mouth at bedtime.   amLODipine 5 MG tablet Commonly known as:  NORVASC Take 1 tablet (5 mg total) by mouth daily.   atorvastatin 10 MG tablet Commonly known as:  LIPITOR Take 1 tablet (10 mg total) by mouth daily.   carvedilol 25 MG tablet Commonly known as:  COREG Take 1 tablet (25 mg total) by mouth 2 (two) times daily with a meal.   famotidine 20 MG tablet Commonly known as:  PEPCID Take 1 tablet (20 mg total) by mouth 2 (two) times daily as needed for heartburn or indigestion.   glucose blood test strip TEST QID Dx E10.9   insulin aspart 100 UNIT/ML injection Commonly known as:  novoLOG INJECT 12 UNITS INTO THE SKIN 3 TIMES DAILY BEFORE MEALS-SLIDING SCALE IF ABOVE 200-2 UNITS EVERY 50   insulin detemir 100 UNIT/ML injection Commonly known as:  LEVEMIR Inject 0.3 mLs (30 Units total) into the skin at bedtime.   lisinopril 20 MG tablet Commonly known as:  PRINIVIL,ZESTRIL Take 1 tablet (20 mg total) by mouth daily.   montelukast 10 MG tablet Commonly known as:  SINGULAIR Take 1 tablet (10 mg total) by mouth at bedtime.   ONETOUCH DELICA LANCETS 33G Misc Test BS QID and PRN dx E11.9   SYRINGE 3CC/25GX5/8" 25G  X 5/8" 3 ML Misc 1 each by Does not apply route 4 (four) times daily.   SYRINGE-NEEDLE (DISP) 3 ML 27G X 1-1/4" 3 ML Misc Use up to QID and as needed          Objective:    BP (!) 162/85   Pulse 92   Temp 98.2 F (36.8 C) (Oral)   Wt Readings from Last 3 Encounters:  No data found for Wt    Physical Exam  Constitutional: He is oriented to person, place, and time. He appears well-developed and well-nourished. No distress.  Eyes: Conjunctivae are normal. No scleral icterus.  Neck: Neck supple. No thyromegaly  present.  Cardiovascular: Normal rate, regular rhythm, normal heart sounds and intact distal pulses.  No murmur heard. Pulmonary/Chest: Effort normal and breath sounds normal. No respiratory distress. He has no wheezes.  Musculoskeletal: Normal range of motion. He exhibits no edema.  Lymphadenopathy:    He has no cervical adenopathy.  Neurological: He is alert and oriented to person, place, and time. Coordination normal.  Skin: Skin is warm and dry. No rash noted. He is not diaphoretic.  Psychiatric: He has a normal mood and affect. His behavior is normal.  Nursing note and vitals reviewed.       Assessment & Plan:   Problem List Items Addressed This Visit      Cardiovascular and Mediastinum   Hypertension associated with diabetes (HCC)   Relevant Medications   amLODipine (NORVASC) 5 MG tablet     Endocrine   Mixed diabetic hyperlipidemia associated with type 1 diabetes mellitus (HCC) - Primary   Relevant Medications   amLODipine (NORVASC) 5 MG tablet   Controlled type 1 diabetes mellitus (HCC)   Relevant Orders   Bayer DCA Hb A1c Waived (Completed)     Other   Blindness       Follow up plan: Return in about 3 months (around 06/04/2018), or if symptoms worsen or fail to improve, for Recheck diabetes.  Counseling provided for all of the vaccine components Orders Placed This Encounter  Procedures  . [facility] A1[facility] Waived    Arville CareJoshua , MD [facility] 03/04/2018, 4:17 PM

## 2018-04-24 ENCOUNTER — Ambulatory Visit (INDEPENDENT_AMBULATORY_CARE_PROVIDER_SITE_OTHER): Payer: Medicare Other | Admitting: Family Medicine

## 2018-04-24 ENCOUNTER — Encounter: Payer: Self-pay | Admitting: Family Medicine

## 2018-04-24 VITALS — BP 154/84 | HR 97 | Temp 99.0°F | Ht 63.0 in

## 2018-04-24 DIAGNOSIS — R739 Hyperglycemia, unspecified: Secondary | ICD-10-CM

## 2018-04-24 LAB — GLUCOSE HEMOCUE WAIVED: Glu Hemocue Waived: 301 mg/dL — ABNORMAL HIGH (ref 65–99)

## 2018-04-24 NOTE — Progress Notes (Signed)
Subjective: CC: high BGs PCP: Dettinger, Elige Radon, MD [facility] B. Rhyan is a 53 y.o. male presenting to clinic today for:  1. Elevated blood sugars Patient reports elevated blood sugars over the last several days.  He notes that they have been running well into the 2 and 300s.  He notes associated thick saliva, thirst and increased urinary output.  He denies any other symptoms including cough, congestion, shortness of breath, diarrhea, constipation, nausea or vomiting.  No fevers.  He is compliant with diabetic regimen for type 1 diabetes and has had no dietary changes.   ROS: Per HPI  No Known Allergies Past Medical History:  Diagnosis Date  . Allergy   . Diabetes mellitus without complication (HCC) 1997   type 1  . GERD (gastroesophageal reflux disease)   . Hyperlipidemia   . Hypertension   . Neuropathy   . Optic atrophy   . Paralysis (HCC)    from a viral infection in young child hood. has some sensation in lower extremity but unable to stand or walk    Current Outpatient Medications:  .  amitriptyline (ELAVIL) 25 MG tablet, Take 1 tablet (25 mg total) by mouth at bedtime., Disp: 90 tablet, Rfl: 3 .  amLODipine (NORVASC) 5 MG tablet, Take 1 tablet (5 mg total) by mouth daily., Disp: 90 tablet, Rfl: 3 .  atorvastatin (LIPITOR) 10 MG tablet, Take 1 tablet (10 mg total) by mouth daily., Disp: 90 tablet, Rfl: 3 .  carvedilol (COREG) 25 MG tablet, Take 1 tablet (25 mg total) by mouth 2 (two) times daily with a meal., Disp: 180 tablet, Rfl: 3 .  famotidine (PEPCID) 20 MG tablet, Take 1 tablet (20 mg total) by mouth 2 (two) times daily as needed for heartburn or indigestion., Disp: 180 tablet, Rfl: 3 .  glucose blood (ONETOUCH VERIO) test strip, TEST QID Dx E10.9, Disp: 400 each, Rfl: 3 .  insulin aspart (NOVOLOG) 100 UNIT/ML injection, INJECT 12 UNITS INTO THE SKIN 3 TIMES DAILY BEFORE MEALS-SLIDING SCALE IF ABOVE 200-2 UNITS EVERY 50, Disp: 10 mL, Rfl: 3 .  insulin detemir  (LEVEMIR) 100 UNIT/ML injection, Inject 0.3 mLs (30 Units total) into the skin at bedtime., Disp: 30 mL, Rfl: 3 .  lisinopril (PRINIVIL,ZESTRIL) 20 MG tablet, Take 1 tablet (20 mg total) by mouth daily., Disp: 90 tablet, Rfl: 3 .  montelukast (SINGULAIR) 10 MG tablet, Take 1 tablet (10 mg total) by mouth at bedtime., Disp: 90 tablet, Rfl: 3 .  ONETOUCH DELICA LANCETS 33G MISC, Test BS QID and PRN dx E11.9, Disp: 400 each, Rfl: 3 .  SYRINGE-NEEDLE, DISP, 3 ML (MONOJECT 3CC SYR 27GX1-1/4") 27G X 1-1/4" 3 ML MISC, Use up to QID and as needed, Disp: 400 each, Rfl: 3 .  Syringe/Needle, Disp, (SYRINGE 3CC/25GX5/8") 25G X 5/8" 3 ML MISC, 1 each by Does not apply route 4 (four) times daily., Disp: 400 each, Rfl: 3 Social History   Socioeconomic History  . Marital status: Married    Spouse name: Not on file  . Number of children: Not on file  . Years of education: 71  . Highest education level: Associate degree: academic program  Occupational History  . Occupation: Disabled  Social Needs  . Financial resource strain: Not hard at all  . Food insecurity:    Worry: Never true    Inability: Never true  . Transportation needs:    Medical: No    Non-medical: No  Tobacco Use  . Smoking status: Never  Smoker  . Smokeless tobacco: Never Used  Substance and Sexual Activity  . Alcohol use: No  . Drug use: No  . Sexual activity: Yes    Comment: with spouse since 2015  Lifestyle  . Physical activity:    Days per week: 0 days    Minutes per session: 0 min  . Stress: Only a little  Relationships  . Social connections:    Talks on phone: More than three times a week    Gets together: More than three times a week    Attends religious service: More than 4 times per year    Active member of club or organization: Yes    Attends meetings of clubs or organizations: More than 4 times per year    Relationship status: Married  . Intimate partner violence:    Fear of current or ex partner: No     Emotionally abused: No    Physically abused: No    Forced sexual activity: No  Other Topics Concern  . Not on file  Social History Narrative  . Not on file   Family History  Problem Relation Age of Onset  . Alzheimer's disease Mother   . Hypertension Father   . Pneumonia Father   . Cancer Brother        gall bladder and liver  . Cancer Maternal Grandmother        cervical  . Cancer Maternal Grandfather        stomach  . Cancer Paternal Grandfather        colon    Objective: Office vital signs reviewed. BP (!) 154/84   Pulse 97   Temp 99 F (37.2 C) (Oral)   Ht 5\' 3"  (1.6 m)   Physical Examination:  General: Awake, alert, nontoxic, No acute distress HEENT: Normal, sclera white, nystagmus noted. MMM Cardio: regular rate and rhythm, S1S2 heard, no murmurs appreciated Pulm: clear to auscultation bilaterally, no wheezes, rhonchi or rales; normal work of breathing on room air  Assessment/ Plan: 53 y.o. male   1. Elevated blood sugar Blood sugar here in office was 301.  I see no evidence of DKA at this point and therefore no additional testing obtained.  Additionally, he demonstrates no signs or symptoms of infection.  We discussed that etiology of elevated sugars is unknown at this time but if he develops any unusual symptoms he should be evaluated prior to his follow-up in 1 week.  I have advised him to increase his Levemir by 2 units every 3 days for fasting blood sugars greater than 150.  He may continue NovoLog normally with meals.  We discussed that for the next couple of days I would like him monitoring his evening blood sugars and for any blood sugars greater than 400 he is to administer 2 units of NovoLog.  We discussed that blood sugars greater than 500 or any symptoms of diabetic ketoacidosis should warrant emergent evaluation.  I reviewed the symptoms and provided a handout.  His instructions were detailed in his AVS and these were reviewed with his family member, who  was present during today's visit as well. - Glucose Hemocue Waived   Orders Placed This Encounter  Procedures  . Glucose Hemocue Waived  . Urinalysis   No orders of the defined types were placed in this encounter.    Raliegh Ip, DO [facility] [phone]

## 2018-04-24 NOTE — Patient Instructions (Signed)
Your blood sugar is elevated but you are not demonstrating signs of diabetic ketoacidosis.  This is reassuring.  I do think that your increased thirst and thick saliva is related to the high blood sugar.  I am recommending that you increase your Levemir dose by 2 units every 3 days for fasting blood sugars greater than 150.  This means if you have a blood sugar when you wake up greater than 150 for 2 out of 3 days, you should increase the dose of levemir.  If your blood sugar is less than 150 you can stop at that dose.  If you measure blood sugars greater than 400 before bedtime, you may administer 2 additional units of NovoLog.  If you develop blood sugars greater than 500, please seek immediate medical attention in the emergency department.  I have included a handout with the signs and symptoms of diabetic ketoacidosis.  A 1 week follow-up for blood sugar check has been scheduled with Dr. Louanne Skye.   Diabetic Ketoacidosis Diabetic ketoacidosis is a serious complication of diabetes. This condition develops when there is not enough insulin in the body. Insulin is an hormone that regulates blood sugar levels in the body. Normally, insulin allows glucose to enter the cells in the body. The cells break down glucose for energy. Without enough insulin, the body cannot break down glucose, so it breaks down fats instead. This leads to high blood glucose levels in the body and the production of acids that are called ketones. Ketones are poisonous at high levels. If diabetic ketoacidosis is not treated, it can cause severe dehydration and can lead to a coma or death. What are the causes? This condition develops when a lack of insulin causes the body to break down fats instead of glucose. This may be triggered by:  Stress on the body. This stress is brought on by an illness.  Infection.  Medicines that raise blood glucose levels.  Not taking diabetes medicine.  New onset of type 1 diabetes  mellitus. What are the signs or symptoms? Symptoms of this condition include:  Fatigue.  Weight loss.  Excessive thirst.  Light-headedness.  Fruity or sweet-smelling breath.  Excessive urination.  Vision changes.  Confusion or irritability.  Nausea.  Vomiting.  Rapid breathing.  Abdominal pain.  Feeling flushed. How is this diagnosed? This condition is diagnosed based on your medical history, a physical exam, and blood tests. You may also have a urine test to check for ketones. How is this treated? This condition may be treated with:  Fluid replacement. This may be done to correct dehydration.  Insulin injections. These may be given through the skin or through an IV tube.  Electrolyte replacement. Electrolytes are minerals in your blood. Electrolytes such as potassium and sodium may be given in pill form or through an IV tube.  Antibiotic medicines. These may be prescribed if your condition was caused by an infection. Diabetic ketoacidosis is a serious medical condition. You may need emergency treatment in the hospital to monitor your condition. Follow these instructions at home: Eating and drinking  Drink enough fluids to keep your urine clear or pale yellow.  If you are not able to eat, drink clear fluids in small amounts as you are able. Clear fluids include water, ice chips, fruit juice with water added (diluted), and low-calorie sports drinks. You may also have sugar-free jello or popsicles.  If you are able to eat, follow your usual diet and drink sugar-free liquids, such as water. Medicines  Take over-the-counter and prescription medicines only as told by your health care provider.  Continue to take insulin and other diabetes medicines as told by your health care provider.  If you were prescribed an antibiotic, take it as told by your health care provider. Do not stop taking the antibiotic even if you start to feel better. General  instructions   Check your urine for ketones when you are ill and as told by your health care provider. ? If your blood glucose is 240 mg/dL (07.3 mmol/L) or higher, check your urine ketones every 4-6 hours.  Check your blood glucose every day, as often as told by your health care provider. ? If your blood glucose is high, drink plenty of fluids. This helps to flush out ketones. ? If your blood glucose is above your target for 2 tests in a row, contact your health care provider.  Carry a medical alert card or wear medical alert jewelry that says that you have diabetes.  Rest and exercise only as told by your health care provider. Do not exercise when your blood glucose is high and you have ketones in your urine.  If you get sick, call your health care provider and begin treatment quickly. Your body often needs extra insulin to fight an illness. Check your blood glucose every 4-6 hours when you are sick.  Keep all follow-up visits as told by your health care provider. This is important. Contact a health care provider if:  Your blood glucose level is higher than 240 mg/dL (71.0 mmol/L) for 2 days in a row.  You have moderate or large ketones in your urine.  You have a fever.  You cannot eat or drink without vomiting.  You have been vomiting for more than 2 hours.  You continue to have symptoms of diabetic ketoacidosis.  You develop new symptoms. Get help right away if:  Your blood glucose monitor reads "high" even when you are taking insulin.  You faint.  You have chest pain.  You have trouble breathing.  You have sudden trouble speaking or swallowing.  You have vomiting or diarrhea that gets worse after 3 hours.  You are unable to stay awake.  You have trouble thinking.  You are severely dehydrated. Symptoms of severe dehydration include: ? Extreme thirst. ? Dry mouth. ? Rapid breathing. These symptoms may represent a serious problem that is an emergency. Do not  wait to see if the symptoms will go away. Get medical help right away. Call your local emergency services (911 in the [place] Do not drive yourself to the hospital. Summary  Diabetic ketoacidosis is a serious complication of diabetes. This condition develops when there is not enough insulin in the body.  This condition is diagnosed based on your medical history, a physical exam, and blood tests. You may also have a urine test to check for ketones.  Diabetic ketoacidosis is a serious medical condition. You may need emergency treatment in the hospital to monitor your condition.  Contact your health care provider if your blood glucose is higher than 240 mg/dl for 2 days in a row or if you have moderate or large ketones in your urine. This information is not intended to replace advice given to you by your health care provider. Make sure you discuss any questions you have with your health care provider. Document Released: 03/24/2000 Document Revised: 05/01/2016 Document Reviewed: 05/01/2016 Elsevier Interactive Patient Education  2019 [employer].

## 2018-05-01 ENCOUNTER — Encounter: Payer: Self-pay | Admitting: Family Medicine

## 2018-05-01 ENCOUNTER — Ambulatory Visit (INDEPENDENT_AMBULATORY_CARE_PROVIDER_SITE_OTHER): Payer: Medicare Other | Admitting: Family Medicine

## 2018-05-01 VITALS — BP 149/84 | HR 92 | Temp 97.8°F

## 2018-05-01 DIAGNOSIS — I10 Essential (primary) hypertension: Secondary | ICD-10-CM | POA: Diagnosis not present

## 2018-05-01 DIAGNOSIS — E1069 Type 1 diabetes mellitus with other specified complication: Secondary | ICD-10-CM | POA: Diagnosis not present

## 2018-05-01 DIAGNOSIS — E785 Hyperlipidemia, unspecified: Secondary | ICD-10-CM

## 2018-05-01 DIAGNOSIS — E1159 Type 2 diabetes mellitus with other circulatory complications: Secondary | ICD-10-CM

## 2018-05-01 DIAGNOSIS — E782 Mixed hyperlipidemia: Secondary | ICD-10-CM | POA: Diagnosis not present

## 2018-05-01 DIAGNOSIS — I152 Hypertension secondary to endocrine disorders: Secondary | ICD-10-CM

## 2018-05-01 MED ORDER — FAMOTIDINE 20 MG PO TABS: 20.0000 mg | ORAL_TABLET | Freq: Two times a day (BID) | ORAL | 3 refills | Status: AC | PRN

## 2018-05-01 MED ORDER — ATORVASTATIN CALCIUM 10 MG PO TABS: 10.0000 mg | ORAL_TABLET | Freq: Every day | ORAL | 3 refills | Status: AC

## 2018-05-01 MED ORDER — FREESTYLE LIBRE 14 DAY READER DEVI: 1.0000 | Freq: Four times a day (QID) | 1 refills | Status: AC

## 2018-05-01 MED ORDER — MONTELUKAST SODIUM 10 MG PO TABS: 10.0000 mg | ORAL_TABLET | Freq: Every day | ORAL | 3 refills | Status: AC

## 2018-05-01 MED ORDER — INSULIN DETEMIR 100 UNIT/ML ~~LOC~~ SOLN: 30.0000 [IU] | Freq: Every day | SUBCUTANEOUS | 3 refills | Status: AC

## 2018-05-01 MED ORDER — INSULIN ASPART 100 UNIT/ML ~~LOC~~ SOLN: SUBCUTANEOUS | 3 refills | Status: DC

## 2018-05-01 MED ORDER — "SYRINGE 25G X 5/8"" 3 ML MISC": 1.0000 | Freq: Four times a day (QID) | 3 refills | Status: AC

## 2018-05-01 MED ORDER — AMLODIPINE BESYLATE 5 MG PO TABS: 5.0000 mg | ORAL_TABLET | Freq: Every day | ORAL | 3 refills | Status: AC

## 2018-05-01 MED ORDER — CARVEDILOL 25 MG PO TABS: 25.0000 mg | ORAL_TABLET | Freq: Two times a day (BID) | ORAL | 3 refills | Status: AC

## 2018-05-01 MED ORDER — "SYRINGE/NEEDLE (DISP) 27G X 1-1/4"" 3 ML MISC": 3 refills | Status: AC

## 2018-05-01 MED ORDER — GLUCOSE BLOOD VI STRP: ORAL_STRIP | 3 refills | Status: AC

## 2018-05-01 MED ORDER — FREESTYLE LIBRE 14 DAY SENSOR MISC: 1.0000 | 3 refills | Status: AC

## 2018-05-01 MED ORDER — ONETOUCH DELICA LANCETS 33G MISC: 3 refills | Status: AC

## 2018-05-01 MED ORDER — AMITRIPTYLINE HCL 25 MG PO TABS: 25.0000 mg | ORAL_TABLET | Freq: Every day | ORAL | 3 refills | Status: AC

## 2018-05-01 MED ORDER — LISINOPRIL 20 MG PO TABS: 20.0000 mg | ORAL_TABLET | Freq: Every day | ORAL | 3 refills | Status: AC

## 2018-05-01 NOTE — Patient Instructions (Signed)
Increase the Levemir to 34 units nightly  Increase the NovoLog to 14 units 3 times daily with meals  Call me immediately if there are any low blood sugars below 75  Call me in 1 week with some blood sugar numbers to see if we need to adjust again

## 2018-05-01 NOTE — Progress Notes (Signed)
BP (!) 149/84   Pulse 92   Temp 97.8 F (36.6 C) (Oral)   SpO2 99%    Subjective:    Patient ID: Brady Rios, male    DOB: February 22, 1966, 53 y.o.   MRN: [id number]  HPI: Brady Rios is a 53 y.o. male presenting on 05/01/2018 for Hyperglycemia (Patient was seen 1/15 and patient's bs has been a little lower but still in the 200's.)   HPI Type 1 diabetes mellitus Patient comes in today for recheck of his diabetes. Patient has been currently taking Levemir 30 and NovoLog 12 3 times daily. Patient is currently on an ACE inhibitor/ARB. Patient has not seen an ophthalmologist this year because he is legally blind. Patient denies any issues with their feet.  Patient's blood sugars have been rising up since the new year happen, they have been running up in the 200s and 300s in the morning and then they will run up even higher through the day.  He is currently still taking the Levemir 30 and the NovoLog 12 3 times daily, he was instructed to try and increase that at the last visit with 1 of my colleagues but he has not done that as of yet and the blood sugars are still running about the same.  Relevant past medical, surgical, family and social history reviewed and updated as indicated. Interim medical history since our last visit reviewed. Allergies and medications reviewed and updated.  Review of Systems  Constitutional: Negative for chills and fever.  Respiratory: Negative for shortness of breath and wheezing.   Cardiovascular: Negative for chest pain and leg swelling.  Endocrine: Positive for polydipsia and polyuria.  Musculoskeletal: Negative for back pain and gait problem.  Skin: Negative for rash.  All other systems reviewed and are negative.   Per HPI unless specifically indicated above        Objective:    BP (!) 149/84   Pulse 92   Temp 97.8 F (36.6 C) (Oral)   SpO2 99%   Wt Readings from Last 3 Encounters:  No data found for Wt    Physical Exam Vitals signs and  nursing note reviewed.  Constitutional:      General: He is not in acute distress.    Appearance: He is well-developed. He is not diaphoretic.     Comments: Wheelchair-bound and blind  Eyes:     General: No scleral icterus.    Conjunctiva/sclera: Conjunctivae normal.  Neck:     Thyroid: No thyromegaly.  Skin:    General: Skin is warm and dry.     Findings: No rash.  Neurological:     Mental Status: He is alert and oriented to person, place, and time.     Coordination: Coordination normal.  Psychiatric:        Behavior: Behavior normal.         Assessment & Plan:   Problem List Items Addressed This Visit      Cardiovascular and Mediastinum   Hypertension associated with diabetes (HCC)   Relevant Medications   lisinopril (PRINIVIL,ZESTRIL) 20 MG tablet   atorvastatin (LIPITOR) 10 MG tablet   carvedilol (COREG) 25 MG tablet   amLODipine (NORVASC) 5 MG tablet   insulin aspart (NOVOLOG) 100 UNIT/ML injection   insulin detemir (LEVEMIR) 100 UNIT/ML injection     Endocrine   Mixed diabetic hyperlipidemia associated with type 1 diabetes mellitus (HCC) - Primary   Relevant Medications   lisinopril (PRINIVIL,ZESTRIL) 20 MG tablet   atorvastatin (  LIPITOR) 10 MG tablet   carvedilol (COREG) 25 MG tablet   amLODipine (NORVASC) 5 MG tablet   insulin aspart (NOVOLOG) 100 UNIT/ML injection   insulin detemir (LEVEMIR) 100 UNIT/ML injection   ONETOUCH DELICA LANCETS 33G MISC   Syringe/Needle, Disp, (SYRINGE 3CC/25GX5/8") 25G X 5/8" 3 ML MISC    Other Visit Diagnoses    Essential hypertension       Relevant Medications   lisinopril (PRINIVIL,ZESTRIL) 20 MG tablet   atorvastatin (LIPITOR) 10 MG tablet   carvedilol (COREG) 25 MG tablet   amLODipine (NORVASC) 5 MG tablet   Hyperlipidemia LDL goal <100       Relevant Medications   lisinopril (PRINIVIL,ZESTRIL) 20 MG tablet   atorvastatin (LIPITOR) 10 MG tablet   carvedilol (COREG) 25 MG tablet   amLODipine (NORVASC) 5 MG tablet        Patient needs a refill of medications cholesterol and diabetes  We will send freestyle libre and increase his Levemir to 34 units and NovoLog to 14 units 3 times daily Follow up plan: Return if symptoms worsen or fail to improve.  Counseling provided for all of the vaccine components No orders of the defined types were placed in this encounter.   Arville Care, MD [facility] 05/01/2018, 4:01 PM

## 2018-05-02 ENCOUNTER — Telehealth: Payer: Self-pay | Admitting: Family Medicine

## 2018-05-02 NOTE — Telephone Encounter (Signed)
Does he want me to try sending it to the local pharmacy to see how much it would cost, if not he can continue with the fingersticks

## 2018-05-02 NOTE — Telephone Encounter (Signed)
lmtcb

## 2018-05-06 NOTE — Telephone Encounter (Signed)
Patient does not wish to get the Dahlgren at this time and will just continue with the finger sticks.

## 2018-05-08 ENCOUNTER — Telehealth: Payer: Self-pay | Admitting: Family Medicine

## 2018-05-08 NOTE — Telephone Encounter (Signed)
Okay thanks, have him continue to monitor and I will not adjust anything for now but we will see how it goes over the next week or 2.  That does sound a lot better than it had been running.

## 2018-05-08 NOTE — Telephone Encounter (Signed)
FYI for provider

## 2018-05-08 NOTE — Telephone Encounter (Signed)
Aware. 

## 2018-05-09 ENCOUNTER — Other Ambulatory Visit: Payer: Self-pay | Admitting: *Deleted

## 2018-05-09 DIAGNOSIS — E1069 Type 1 diabetes mellitus with other specified complication: Secondary | ICD-10-CM

## 2018-05-09 DIAGNOSIS — E782 Mixed hyperlipidemia: Principal | ICD-10-CM

## 2018-05-09 MED ORDER — INSULIN ASPART 100 UNIT/ML ~~LOC~~ SOLN: 12.0000 [IU] | Freq: Three times a day (TID) | SUBCUTANEOUS | 2 refills | Status: DC

## 2018-05-09 NOTE — Telephone Encounter (Signed)
RTC to pt - when he received his mail order medications he rcvd only one bottle of Novolog, he usually receives 3 bottles for 90 day supply. Resent Rx - sent 40 mls with 2 RFs which is a little over 90 day supply which will cover the sliding scale if he needs to give more insulin during the 90 days

## 2018-05-16 ENCOUNTER — Telehealth: Payer: Self-pay | Admitting: Family Medicine

## 2018-05-16 DIAGNOSIS — E782 Mixed hyperlipidemia: Principal | ICD-10-CM

## 2018-05-16 DIAGNOSIS — E1069 Type 1 diabetes mellitus with other specified complication: Secondary | ICD-10-CM

## 2018-05-16 MED ORDER — INSULIN ASPART 100 UNIT/ML ~~LOC~~ SOLN: 12.0000 [IU] | Freq: Three times a day (TID) | SUBCUTANEOUS | 2 refills | Status: DC

## 2018-05-16 NOTE — Telephone Encounter (Signed)
I sent in a new prescription for the patient that should get him more each time, let us know if he continues to have issues.

## 2018-05-16 NOTE — Telephone Encounter (Signed)
Patient aware.

## 2018-06-06 ENCOUNTER — Telehealth: Payer: Self-pay | Admitting: Family Medicine

## 2018-06-06 DIAGNOSIS — E782 Mixed hyperlipidemia: Principal | ICD-10-CM

## 2018-06-06 DIAGNOSIS — E1069 Type 1 diabetes mellitus with other specified complication: Secondary | ICD-10-CM

## 2018-06-06 NOTE — Telephone Encounter (Signed)
Pt states when he woke up this morning his FBS was 111 and then he had 2 eggs, bologna and injected 14 units of Novolog before eating. He checked his blood sugar 2 hours after eating and it had dropped to 66. Pt states he is taking 34 units of Levemir at bed time and would like to know if he can drop it back to 32 units. Please advise.

## 2018-06-06 NOTE — Telephone Encounter (Signed)
Yes go ahead and have him drop back to 32 units, keep me informed if he continues to have low blood sugars

## 2018-06-06 NOTE — Telephone Encounter (Signed)
Pt aware of MD feedback and voiced understanding. 

## 2018-06-10 ENCOUNTER — Ambulatory Visit (INDEPENDENT_AMBULATORY_CARE_PROVIDER_SITE_OTHER): Payer: Medicare Other | Admitting: Family Medicine

## 2018-06-10 ENCOUNTER — Encounter: Payer: Self-pay | Admitting: Family Medicine

## 2018-06-10 VITALS — BP 140/78 | HR 108 | Temp 97.6°F

## 2018-06-10 DIAGNOSIS — E1039 Type 1 diabetes mellitus with other diabetic ophthalmic complication: Secondary | ICD-10-CM

## 2018-06-10 DIAGNOSIS — E1069 Type 1 diabetes mellitus with other specified complication: Secondary | ICD-10-CM | POA: Diagnosis not present

## 2018-06-10 DIAGNOSIS — I152 Hypertension secondary to endocrine disorders: Secondary | ICD-10-CM

## 2018-06-10 DIAGNOSIS — E782 Mixed hyperlipidemia: Secondary | ICD-10-CM

## 2018-06-10 DIAGNOSIS — E1159 Type 2 diabetes mellitus with other circulatory complications: Secondary | ICD-10-CM

## 2018-06-10 DIAGNOSIS — I10 Essential (primary) hypertension: Secondary | ICD-10-CM | POA: Diagnosis not present

## 2018-06-10 LAB — BAYER DCA HB A1C WAIVED: HB A1C (BAYER DCA - WAIVED): 6.7 % (ref <7.0)

## 2018-06-10 NOTE — Progress Notes (Signed)
BP 140/78   Pulse (!) 108   Temp 97.6 F (36.4 C) (Oral)    Subjective:    Patient ID: Brady Rios, male    DOB: [birth year], 53 y.o.   MRN: [id number]  HPI: Brady Rios is a 53 y.o. male presenting on 06/10/2018 for Diabetes (3 month follow up) and Hypertension   HPI Type 2 diabetes mellitus Patient comes in today for recheck of his diabetes. Patient has been currently taking Levemir 32 and NovoLog 14- 3 times daily with meals plus sliding scale as needed. Patient is currently on an ACE inhibitor/ARB. Patient has not seen an ophthalmologist this year. Patient denies any issues with their feet.  Patient is legally blind and is paralyzed below the waist from an accident in his past and is wheelchair-bound.  Has minimal sensation on his feet but denies any sores in his significant other regularly checks his feet.  Hypertension Patient is currently on amlodipine and carvedilol and lisinopril, and their blood pressure today is 140/78. Patient denies any lightheadedness or dizziness. Patient denies headaches, blurred vision, chest pains, shortness of breath, or weakness. Denies any side effects from medication and is content with current medication.   Hyperlipidemia Patient is coming in for recheck of his hyperlipidemia. The patient is currently taking Lipitor. They deny any issues with myalgias or history of liver damage from it. They deny any focal numbness or weakness or chest pain.   Relevant past medical, surgical, family and social history reviewed and updated as indicated. Interim medical history since our last visit reviewed. Allergies and medications reviewed and updated.  Review of Systems  Constitutional: Negative for chills and fever.  Eyes: Positive for visual disturbance.  Respiratory: Negative for shortness of breath and wheezing.   Cardiovascular: Negative for chest pain and leg swelling.  Musculoskeletal: Negative for back pain and gait problem.  Skin: Negative for  rash.  Neurological: Positive for weakness and numbness. Negative for dizziness and light-headedness.  All other systems reviewed and are negative.   Per HPI unless specifically indicated above   Allergies as of 06/10/2018   No Known Allergies     Medication List       Accurate as of June 10, 2018  3:49 PM. Always use your most recent med list.        amitriptyline 25 MG tablet Commonly known as:  ELAVIL Take 1 tablet (25 mg total) by mouth at bedtime.   amLODipine 5 MG tablet Commonly known as:  NORVASC Take 1 tablet (5 mg total) by mouth daily.   atorvastatin 10 MG tablet Commonly known as:  LIPITOR Take 1 tablet (10 mg total) by mouth daily.   carvedilol 25 MG tablet Commonly known as:  COREG Take 1 tablet (25 mg total) by mouth 2 (two) times daily with a meal.   famotidine 20 MG tablet Commonly known as:  PEPCID Take 1 tablet (20 mg total) by mouth 2 (two) times daily as needed for heartburn or indigestion.   FREESTYLE LIBRE 14 DAY READER Devi 1 each by Does not apply route 4 (four) times daily.   FREESTYLE LIBRE 14 DAY SENSOR Misc 1 each by Does not apply route every 14 (fourteen) days.   glucose blood test strip Commonly known as:  ONETOUCH VERIO TEST QID Dx E10.9   insulin aspart 100 UNIT/ML injection Commonly known as:  NOVOLOG Inject 12-50 Units into the skin 3 (three) times daily with meals. SLIDING SCALE IF ABOVE 200-2 UNITS EVERY  50   insulin detemir 100 UNIT/ML injection Commonly known as:  LEVEMIR Inject 0.3 mLs (30 Units total) into the skin at bedtime.   lisinopril 20 MG tablet Commonly known as:  PRINIVIL,ZESTRIL Take 1 tablet (20 mg total) by mouth daily.   montelukast 10 MG tablet Commonly known as:  SINGULAIR Take 1 tablet (10 mg total) by mouth at bedtime.   ONETOUCH DELICA LANCETS 03E Misc Test BS QID and PRN dx E11.9   SYRINGE 3CC/25GX5/8" 25G X 5/8" 3 ML Misc 1 each by Does not apply route 4 (four) times daily.     SYRINGE-NEEDLE (DISP) 3 ML 27G X 1-1/4" 3 ML Misc Commonly known as:  MONOJECT 3CC SYR 27GX1-1/4" Use up to QID and as needed          Objective:    BP 140/78   Pulse (!) 108   Temp 97.6 F (36.4 C) (Oral)   Wt Readings from Last 3 Encounters:  No data found for Wt    Physical Exam Vitals signs and nursing note reviewed.  Constitutional:      General: He is not in acute distress.    Appearance: He is well-developed. He is not diaphoretic.  Neck:     Musculoskeletal: Neck supple.     Thyroid: No thyromegaly.  Cardiovascular:     Rate and Rhythm: Normal rate and regular rhythm.     Heart sounds: Normal heart sounds. No murmur.  Pulmonary:     Effort: Pulmonary effort is normal. No respiratory distress.     Breath sounds: Normal breath sounds. No wheezing.  Musculoskeletal: Normal range of motion.        General: No swelling.  Lymphadenopathy:     Cervical: No cervical adenopathy.  Skin:    General: Skin is warm and dry.     Findings: No rash.  Neurological:     Mental Status: He is alert and oriented to person, place, and time.     Coordination: Coordination normal.  Psychiatric:        Behavior: Behavior normal.         Assessment & Plan:   Problem List Items Addressed This Visit      Cardiovascular and Mediastinum   Hypertension associated with diabetes ([place])   Relevant Orders   Lipid panel     Endocrine   Mixed diabetic hyperlipidemia associated with type 1 diabetes mellitus ([place]) - Primary   Relevant Orders   CBC with Differential/Platelet   CMP14+EGFR   Bayer DCA Hb A1c Waived   Lipid panel   Controlled type 1 diabetes mellitus (HCC)   Relevant Orders   Bayer DCA Hb A1c Waived      Patient currently on Levemir 32 and NovoLog 14- 3 times daily with meals plus sliding scale if needed, blood sugar is doing much better Follow up plan: Return in about 3 months (around 09/10/2018), or if symptoms worsen or fail to improve, for  Diabetes.  Counseling provided for all of the vaccine components Orders Placed This Encounter  Procedures  . CBC with Differential/Platelet  . CMP14+EGFR  . Bayer DCA Hb A1c Waived  . Lipid panel    Caryl Pina, MD [facility] 06/10/2018, 3:49 PM

## 2018-06-11 LAB — CBC WITH DIFFERENTIAL/PLATELET
Basophils Absolute: 0 10*3/uL (ref 0.0–0.2)
Basos: 1 %
EOS (ABSOLUTE): 0.1 10*3/uL (ref 0.0–0.4)
EOS: 1 %
Hematocrit: 37.9 % (ref 37.5–51.0)
Hemoglobin: 11.2 g/dL — ABNORMAL LOW (ref 13.0–17.7)
Immature Grans (Abs): 0 10*3/uL (ref 0.0–0.1)
Immature Granulocytes: 0 %
Lymphocytes Absolute: 0.9 10*3/uL (ref 0.7–3.1)
Lymphs: 22 %
MCH: 21.6 pg — ABNORMAL LOW (ref 26.6–33.0)
MCHC: 29.6 g/dL — ABNORMAL LOW (ref 31.5–35.7)
MCV: 73 fL — ABNORMAL LOW (ref 79–97)
MONOCYTES: 9 %
Monocytes Absolute: 0.4 10*3/uL (ref 0.1–0.9)
NRBC: 1 % — ABNORMAL HIGH (ref 0–0)
Neutrophils Absolute: 2.6 10*3/uL (ref 1.4–7.0)
Neutrophils: 67 %
Platelets: 113 10*3/uL — ABNORMAL LOW (ref 150–450)
RBC: 5.18 x10E6/uL (ref 4.14–5.80)
RDW: 25 % — ABNORMAL HIGH (ref 11.6–15.4)
WBC: 3.9 10*3/uL (ref 3.4–10.8)

## 2018-06-11 LAB — CMP14+EGFR
A/G RATIO: 2.3 — AB (ref 1.2–2.2)
ALT: 13 IU/L (ref 0–44)
AST: 16 IU/L (ref 0–40)
Albumin: 4.1 g/dL (ref 3.8–4.9)
Alkaline Phosphatase: 92 IU/L (ref 39–117)
BUN/Creatinine Ratio: 15 (ref 9–20)
BUN: 9 mg/dL (ref 6–24)
Bilirubin Total: 1 mg/dL (ref 0.0–1.2)
CHLORIDE: 100 mmol/L (ref 96–106)
CO2: 20 mmol/L (ref 20–29)
Calcium: 8.5 mg/dL — ABNORMAL LOW (ref 8.7–10.2)
Creatinine, Ser: 0.61 mg/dL — ABNORMAL LOW (ref 0.76–1.27)
GFR calc non Af Amer: 113 mL/min/{1.73_m2} (ref >59)
GFR, EST AFRICAN AMERICAN: 131 mL/min/{1.73_m2} (ref >59)
Globulin, Total: 1.8 g/dL (ref 1.5–4.5)
Glucose: 233 mg/dL — ABNORMAL HIGH (ref 65–99)
POTASSIUM: 4 mmol/L (ref 3.5–5.2)
Sodium: 138 mmol/L (ref 134–144)
Total Protein: 5.9 g/dL — ABNORMAL LOW (ref 6.0–8.5)

## 2018-06-11 LAB — LIPID PANEL
Chol/HDL Ratio: 2.4 ratio (ref 0.0–5.0)
Cholesterol, Total: 106 mg/dL (ref 100–199)
HDL: 45 mg/dL (ref >39)
LDL Calculated: 38 mg/dL (ref 0–99)
Triglycerides: 117 mg/dL (ref 0–149)
VLDL Cholesterol Cal: 23 mg/dL (ref 5–40)

## 2018-06-24 ENCOUNTER — Telehealth: Payer: Self-pay | Admitting: Family Medicine

## 2018-06-24 NOTE — Telephone Encounter (Signed)
Patient reports that right before eating dinner last night his blood sugar was 148.  He had pork chops and cabbage for dinner.  Two hours later his blood sugar dropped to 58.  He ate a candy bar and checked blood sugar again and it had gone up to 196.  He would like to know if you think he should adjust his Novolog dosage or do something else?

## 2018-06-24 NOTE — Telephone Encounter (Signed)
Pt aware - he would rather to 12 units instead of 10 at this time. (was on 14 units)

## 2018-06-24 NOTE — Telephone Encounter (Signed)
Yes go ahead and drop his NovoLog down to 10 units 3 times a day with meals and keep a track on it let me know what happens.

## 2018-07-25 ENCOUNTER — Encounter: Payer: Self-pay | Admitting: Family Medicine

## 2018-07-25 ENCOUNTER — Other Ambulatory Visit: Payer: Self-pay

## 2018-07-25 ENCOUNTER — Ambulatory Visit (INDEPENDENT_AMBULATORY_CARE_PROVIDER_SITE_OTHER): Payer: Medicare Other | Admitting: Family Medicine

## 2018-07-25 DIAGNOSIS — J301 Allergic rhinitis due to pollen: Secondary | ICD-10-CM | POA: Diagnosis not present

## 2018-07-25 DIAGNOSIS — E782 Mixed hyperlipidemia: Secondary | ICD-10-CM | POA: Diagnosis not present

## 2018-07-25 DIAGNOSIS — E1069 Type 1 diabetes mellitus with other specified complication: Secondary | ICD-10-CM

## 2018-07-25 DIAGNOSIS — E1065 Type 1 diabetes mellitus with hyperglycemia: Secondary | ICD-10-CM

## 2018-07-25 DIAGNOSIS — R739 Hyperglycemia, unspecified: Secondary | ICD-10-CM | POA: Diagnosis not present

## 2018-07-25 MED ORDER — FEXOFENADINE HCL 180 MG PO TABS: 180.0000 mg | ORAL_TABLET | Freq: Every day | ORAL | 3 refills | Status: AC

## 2018-07-25 MED ORDER — INSULIN ASPART 100 UNIT/ML ~~LOC~~ SOLN: 12.0000 [IU] | Freq: Three times a day (TID) | SUBCUTANEOUS | 2 refills | Status: AC

## 2018-07-25 NOTE — Progress Notes (Signed)
No chief complaint on file.   HPI  Patient presents today for blood sugar of 356 today. Has been in the 200s for the last week. Reports eating normally. Today had a sandwich and chips.  He actually eats a sandwich and chips every day.  He says that his sugar does not usually go up from this.  Additionally the patient says that he has the sliding scale and he understands it.  We reviewed this together.  He is taking a base of 12 units and adding 2 for every 50 above 200 to that.  He did this when he got the 356.  He added 8 units to the base of 12 to give himself a total of 20 of NovoLog.  His sugar has come back down some.  He is just worried that his sugars going up too much.  Runny nose for several days off and on. Sneezed a couple of times.  Clear discharge several times a day. Denies  Fever. No cough.Taking the Singulair daily.   PMH: Smoking status noted ROS: Review of Systems  Constitutional: Negative for activity change, appetite change, chills and fever.  HENT: Positive for congestion and rhinorrhea. Negative for ear discharge, ear pain, hearing loss, nosebleeds, postnasal drip, sinus pressure and trouble swallowing.   Respiratory: Negative for chest tightness and shortness of breath.   Cardiovascular: Negative for chest pain and palpitations.  Skin: Negative for rash.   Telephone visit, therefore no exam this was agreed to by the patient in light of the sheltering at home orders due to COVID-19  Assessment and plan:  1. Elevated blood sugar   2. Uncontrolled type 1 diabetes mellitus with hyperglycemia (HCC)   3. Seasonal allergic rhinitis due to pollen   4. Mixed diabetic hyperlipidemia associated with type 1 diabetes mellitus (HCC)     Meds ordered this encounter  Medications  . fexofenadine (ALLEGRA) 180 MG tablet    Sig: Take 1 tablet (180 mg total) by mouth daily. For allergy symptoms    Dispense:  90 tablet    Refill:  3  . insulin aspart (NOVOLOG) 100 UNIT/ML injection     Sig: Inject 12-50 Units into the skin 3 (three) times daily with meals. SLIDING SCALE IF ABOVE 150-2 UNITS EVERY 50    Dispense:  100 mL    Refill:  2   Decreased threshold for using SSI to 150. I tried to encourage the pt. To avoid chips and other high carb foods. Limit bread by decreaing number of sandwiches.  Virtual Visit via telephone Note  I discussed the limitations, risks, security and privacy concerns of performing an evaluation and management service by telephone and the availability of in person appointments. I also discussed with the patient that there may be a patient responsible charge related to this service. The patient expressed understanding and agreed to proceed. Pt. Is at home. Dr. Darlyn Read is in his office.  Follow Up Instructions:   I discussed the assessment and treatment plan with the patient. The patient was provided an opportunity to ask questions and all were answered. The patient agreed with the plan and demonstrated an understanding of the instructions.   The patient was advised to call back or seek an in-person evaluation if the symptoms worsen or if the condition fails to improve as anticipated.  Visit started: 5:20 Call ended:  5:45 Total minutes including chart review and phone contact time: 35   Follow up as needed.  Mechele Claude, MD

## 2018-07-26 ENCOUNTER — Encounter (HOSPITAL_COMMUNITY): Payer: Self-pay | Admitting: *Deleted

## 2018-07-26 ENCOUNTER — Other Ambulatory Visit: Payer: Self-pay

## 2018-07-26 ENCOUNTER — Emergency Department (HOSPITAL_COMMUNITY)
Admission: EM | Admit: 2018-07-26 | Discharge: 2018-07-26 | Disposition: A | Discharge status: Home / Self Care | Payer: Medicare Other | Attending: Emergency Medicine | Admitting: Emergency Medicine

## 2018-07-26 ENCOUNTER — Emergency Department (HOSPITAL_COMMUNITY): Payer: Medicare Other

## 2018-07-26 DIAGNOSIS — E785 Hyperlipidemia, unspecified: Secondary | ICD-10-CM | POA: Insufficient documentation

## 2018-07-26 DIAGNOSIS — Z7401 Bed confinement status: Secondary | ICD-10-CM | POA: Diagnosis not present

## 2018-07-26 DIAGNOSIS — Z79899 Other long term (current) drug therapy: Secondary | ICD-10-CM | POA: Insufficient documentation

## 2018-07-26 DIAGNOSIS — I10 Essential (primary) hypertension: Secondary | ICD-10-CM | POA: Insufficient documentation

## 2018-07-26 DIAGNOSIS — R739 Hyperglycemia, unspecified: Secondary | ICD-10-CM

## 2018-07-26 DIAGNOSIS — R05 Cough: Secondary | ICD-10-CM | POA: Diagnosis not present

## 2018-07-26 DIAGNOSIS — R531 Weakness: Secondary | ICD-10-CM | POA: Diagnosis not present

## 2018-07-26 DIAGNOSIS — E1065 Type 1 diabetes mellitus with hyperglycemia: Secondary | ICD-10-CM | POA: Diagnosis not present

## 2018-07-26 DIAGNOSIS — E1165 Type 2 diabetes mellitus with hyperglycemia: Secondary | ICD-10-CM | POA: Diagnosis not present

## 2018-07-26 LAB — BASIC METABOLIC PANEL
Anion gap: 8 (ref 5–15)
BUN: 14 mg/dL (ref 6–20)
CO2: 27 mmol/L (ref 22–32)
Calcium: 8.6 mg/dL — ABNORMAL LOW (ref 8.9–10.3)
Chloride: 106 mmol/L (ref 98–111)
Creatinine, Ser: 0.62 mg/dL (ref 0.61–1.24)
GFR calc Af Amer: >60 mL/min (ref >60)
GFR calc non Af Amer: >60 mL/min (ref >60)
Glucose, Bld: 215 mg/dL — ABNORMAL HIGH (ref 70–99)
Potassium: 3.6 mmol/L (ref 3.5–5.1)
Sodium: 141 mmol/L (ref 135–145)

## 2018-07-26 LAB — CBC
HCT: 38 % — ABNORMAL LOW (ref 39.0–52.0)
Hemoglobin: 11.2 g/dL — ABNORMAL LOW (ref 13.0–17.0)
MCH: 22.3 pg — ABNORMAL LOW (ref 26.0–34.0)
MCHC: 29.5 g/dL — ABNORMAL LOW (ref 30.0–36.0)
MCV: 75.5 fL — ABNORMAL LOW (ref 80.0–100.0)
Platelets: 102 10*3/uL — ABNORMAL LOW (ref 150–400)
RBC: 5.03 MIL/uL (ref 4.22–5.81)
RDW: 24.7 % — ABNORMAL HIGH (ref 11.5–15.5)
WBC: 4.5 10*3/uL (ref 4.0–10.5)
nRBC: 0.4 % — ABNORMAL HIGH (ref 0.0–0.2)

## 2018-07-26 LAB — URINALYSIS, ROUTINE W REFLEX MICROSCOPIC
Bacteria, UA: NONE SEEN
Bilirubin Urine: NEGATIVE
Glucose, UA: >=500 mg/dL — AB
Ketones, ur: NEGATIVE mg/dL
Leukocytes,Ua: NEGATIVE
Nitrite: NEGATIVE
Protein, ur: NEGATIVE mg/dL
Specific Gravity, Urine: 1.025 (ref 1.005–1.030)
pH: 7 (ref 5.0–8.0)

## 2018-07-26 LAB — CBG MONITORING, ED: Glucose-Capillary: 223 mg/dL — ABNORMAL HIGH (ref 70–99)

## 2018-07-26 NOTE — ED Triage Notes (Signed)
States blood glucose has been "running high" x 2 -3 days, reports highest as 446. Came in to be evaluated for high blood sugars

## 2018-07-26 NOTE — ED Notes (Signed)
Per patient's request, in/out cath done.  Patient stated he had some "urine issues".

## 2018-07-26 NOTE — ED Provider Notes (Signed)
[facility] EMERGENCY DEPARTMENT Provider Note   CSN: [id number] Arrival date & time: 07/26/18  1303    History   Chief Complaint Chief Complaint  Patient presents with  . Hyperglycemia    HPI Brady Rios is a 53 y.o. male.     HPI Patient presents with hyperglycemia.  Has been on high for the last week.  Up to like 400.  Will not always be high however.  No recent change in medications although he states they had increased his medicines a few months back and but recently around a month ago decreased his insulin because he was running too low.  Has occasional cough.  No fevers.  No sputum production.  No dysuria.  At the most gets up twice at night to urinate.  He is chronically weak and does not ambulate well. Past Medical History:  Diagnosis Date  . Allergy   . Diabetes mellitus without complication (HCC) 1997   type 1  . GERD (gastroesophageal reflux disease)   . Hyperlipidemia   . Hypertension   . Neuropathy   . Optic atrophy   . Paralysis (HCC)    from a viral infection in young child hood. has some sensation in lower extremity but unable to stand or walk    Patient Active Problem List   Diagnosis Date Noted  . Blindness 05/04/2016  . Controlled type 1 diabetes mellitus (HCC) 07/22/2015  . Hypertension associated with diabetes (HCC) 07/21/2015  . Mixed diabetic hyperlipidemia associated with type 1 diabetes mellitus (HCC) 07/21/2015  . Insomnia 07/21/2015    Past Surgical History:  Procedure Laterality Date  . LEG SURGERY     as a child to try to correct legs        Home Medications    Prior to Admission medications   Medication Sig Start Date End Date Taking? Authorizing Provider  amitriptyline (ELAVIL) 25 MG tablet Take 1 tablet (25 mg total) by mouth at bedtime. 05/01/18  Yes Dettinger, Elige Radon, MD  amLODipine (NORVASC) 5 MG tablet Take 1 tablet (5 mg total) by mouth daily. 05/01/18  Yes Dettinger, Elige Radon, MD  atorvastatin (LIPITOR) 10 MG  tablet Take 1 tablet (10 mg total) by mouth daily. 05/01/18  Yes Dettinger, Elige Radon, MD  carvedilol (COREG) 25 MG tablet Take 1 tablet (25 mg total) by mouth 2 (two) times daily with a meal. 05/01/18  Yes Dettinger, Elige Radon, MD  Continuous Blood Gluc Receiver (FREESTYLE LIBRE 14 DAY READER) DEVI 1 each by Does not apply route 4 (four) times daily. 05/01/18  Yes Dettinger, Elige Radon, MD  Continuous Blood Gluc Sensor (FREESTYLE LIBRE 14 DAY SENSOR) MISC 1 each by Does not apply route every 14 (fourteen) days. 05/01/18  Yes Dettinger, Elige Radon, MD  famotidine (PEPCID) 20 MG tablet Take 1 tablet (20 mg total) by mouth 2 (two) times daily as needed for heartburn or indigestion. 05/01/18  Yes Dettinger, Elige Radon, MD  fexofenadine (ALLEGRA) 180 MG tablet Take 1 tablet (180 mg total) by mouth daily. For allergy symptoms 07/25/18  Yes Stacks, Broadus John, MD  glucose blood (ONETOUCH VERIO) test strip TEST QID Dx E10.9 05/01/18  Yes Dettinger, Elige Radon, MD  insulin aspart (NOVOLOG) 100 UNIT/ML injection Inject 12-50 Units into the skin 3 (three) times daily with meals. SLIDING SCALE IF ABOVE 150-2 UNITS EVERY 50 07/25/18  Yes Stacks, Broadus John, MD  insulin detemir (LEVEMIR) 100 UNIT/ML injection Inject 0.3 mLs (30 Units total) into the skin at bedtime. 05/01/18  Yes Dettinger, Elige RadonJoshua A, MD  lisinopril (PRINIVIL,ZESTRIL) 20 MG tablet Take 1 tablet (20 mg total) by mouth daily. 05/01/18  Yes Dettinger, Elige RadonJoshua A, MD  montelukast (SINGULAIR) 10 MG tablet Take 1 tablet (10 mg total) by mouth at bedtime. 05/01/18  Yes Dettinger, Elige RadonJoshua A, MD  [facility] DELICA LANCETS 33G MISC Test BS QID and PRN dx E11.9 05/01/18  Yes Dettinger, Elige RadonJoshua A, MD  SYRINGE-NEEDLE, DISP, 3 ML (MONOJECT 3CC SYR 27GX1-1/4") 27G X 1-1/4" 3 ML MISC Use up to QID and as needed 05/01/18  Yes Dettinger, Elige RadonJoshua A, MD  Syringe/Needle, Disp, (SYRINGE 3CC/25GX5/8") 25G X 5/8" 3 ML MISC 1 each by Does not apply route 4 (four) times daily. 05/01/18  Yes Dettinger, Elige RadonJoshua A, MD     Family History Family History  Problem Relation Age of Onset  . Alzheimer's disease Mother   . Hypertension Father   . Pneumonia Father   . Cancer Brother        gall bladder and liver  . Cancer Maternal Grandmother        cervical  . Cancer Maternal Grandfather        stomach  . Cancer Paternal Grandfather        colon    Social History Social History   Tobacco Use  . Smoking status: Never Smoker  . Smokeless tobacco: Never Used  Substance Use Topics  . Alcohol use: No  . Drug use: No     Allergies   Patient has no known allergies.   Review of Systems Review of Systems  Constitutional: Negative for appetite change.  HENT: Negative for congestion.   Respiratory: Positive for cough.   Cardiovascular: Negative for chest pain.  Gastrointestinal: Negative for abdominal pain.  Endocrine: Negative for polydipsia, polyphagia and polyuria.  Musculoskeletal: Negative for back pain.  Skin: Negative for rash.  Neurological: Positive for weakness.  Psychiatric/Behavioral: Negative for confusion.     Physical Exam Updated Vital Signs BP (!) 141/76 (BP Location: Right Arm)   Pulse 94   Temp 98.1 F (36.7 C) (Oral)   Resp 18   Ht 5\' 4"  (1.626 m)   Wt 59 kg   SpO2 95%   BMI 22.31 kg/m   Physical Exam Vitals signs and nursing note reviewed.  HENT:     Head: Atraumatic.  Eyes:     Comments: Nystagmus of eyes.  Patient is reportedly blind.  Cardiovascular:     Rate and Rhythm: Normal rate.  Pulmonary:     Breath sounds: No wheezing, rhonchi or rales.  Abdominal:     Tenderness: There is no abdominal tenderness.  Musculoskeletal:        General: No tenderness.     Comments: Chronic atrophy of lower extremities  Skin:    General: Skin is warm.     Capillary Refill: Capillary refill takes less than 2 seconds.  Neurological:     Mental Status: He is alert. Mental status is at baseline.     Comments: Chronic atrophy of lower extremities.      ED  Treatments / Results  Labs (all labs ordered are listed, but only abnormal results are displayed) Labs Reviewed  BASIC METABOLIC PANEL - Abnormal; Notable for the following components:      Result Value   Glucose, Bld 215 (*)    Calcium 8.6 (*)    All other components within normal limits  CBC - Abnormal; Notable for the following components:   Hemoglobin 11.2 (*)  HCT 38.0 (*)    MCV 75.5 (*)    MCH 22.3 (*)    MCHC 29.5 (*)    RDW 24.7 (*)    Platelets 102 (*)    nRBC 0.4 (*)    All other components within normal limits  URINALYSIS, ROUTINE W REFLEX MICROSCOPIC - Abnormal; Notable for the following components:   Glucose, UA >=500 (*)    Hgb urine dipstick SMALL (*)    All other components within normal limits  CBG MONITORING, ED - Abnormal; Notable for the following components:   Glucose-Capillary 223 (*)    All other components within normal limits    EKG None  Radiology Dg Chest Portable 1 View  Result Date: 07/26/2018 CLINICAL DATA:  Cough.  Hyperglycemia EXAM: PORTABLE CHEST 1 VIEW COMPARISON:  None. FINDINGS: Lungs are clear. Heart size and pulmonary vascularity are normal. No adenopathy. No bone lesions. IMPRESSION: No edema or consolidation. Electronically Signed   By: Bretta Bang III M.D.   On: 07/26/2018 13:46    Procedures Procedures (including critical care time)  Medications Ordered in ED Medications - No data to display   Initial Impression / Assessment and Plan / ED Course  I have reviewed the triage vital signs and the nursing notes.  Pertinent labs & imaging results that were available during my care of the patient were reviewed by me and considered in my medical decision making (see chart for details).       Patient with hyperglycemia.  Up to the 400s.  200 something here.  No other clear cause such as infection.  Will need PCP to adjust medicines further.  Will discharge home.  Final Clinical Impressions(s) / ED Diagnoses   Final  diagnoses:  Hyperglycemia    ED Discharge Orders    None       Benjiman Core, MD 07/26/18 1455

## 2018-07-29 ENCOUNTER — Encounter: Payer: Self-pay | Admitting: Family Medicine

## 2018-07-29 ENCOUNTER — Ambulatory Visit (INDEPENDENT_AMBULATORY_CARE_PROVIDER_SITE_OTHER): Payer: Medicare Other | Admitting: Family Medicine

## 2018-07-29 ENCOUNTER — Other Ambulatory Visit: Payer: Self-pay

## 2018-07-29 DIAGNOSIS — E782 Mixed hyperlipidemia: Secondary | ICD-10-CM

## 2018-07-29 DIAGNOSIS — E1065 Type 1 diabetes mellitus with hyperglycemia: Secondary | ICD-10-CM

## 2018-07-29 DIAGNOSIS — E1069 Type 1 diabetes mellitus with other specified complication: Secondary | ICD-10-CM | POA: Diagnosis not present

## 2018-07-29 NOTE — Progress Notes (Signed)
Virtual Visit via telephone Note  I connected with Brady Rios on 07/29/18 at 0958 by telephone and verified that I am speaking with the correct person using two identifiers. Brady Rios is currently located at home and wife are currently with her during visit. The provider, Elige Radon Jaden Abreu, MD is located in their office at time of visit.  Call ended at 1008  I discussed the limitations, risks, security and privacy concerns of performing an evaluation and management service by telephone and the availability of in person appointments. I also discussed with the patient that there may be a patient responsible charge related to this service. The patient expressed understanding and agreed to proceed.   History and Present Illness: Type 1 diabetes mellitus Patient comes in today for recheck of his diabetes. Patient has been currently taking levemir . Patient is currently on an ACE inhibitor/ARB. Patient has not seen an ophthalmologist this year because. Blood sugar was 446 on Thursday which was 4 days ago, gave novolog and it came down. Was 168 this am and 119 yesterday.  Patient is paralyzed below the waist and is wheelchair-bound and has no sensation in his feet but denies any sores  No diagnosis found.  Outpatient Encounter Medications as of 07/29/2018  Medication Sig  . amitriptyline (ELAVIL) 25 MG tablet Take 1 tablet (25 mg total) by mouth at bedtime.  Marland Kitchen amLODipine (NORVASC) 5 MG tablet Take 1 tablet (5 mg total) by mouth daily.  Marland Kitchen atorvastatin (LIPITOR) 10 MG tablet Take 1 tablet (10 mg total) by mouth daily.  . carvedilol (COREG) 25 MG tablet Take 1 tablet (25 mg total) by mouth 2 (two) times daily with a meal.  . Continuous Blood Gluc Receiver (FREESTYLE LIBRE 14 DAY READER) DEVI 1 each by Does not apply route 4 (four) times daily.  . Continuous Blood Gluc Sensor (FREESTYLE LIBRE 14 DAY SENSOR) MISC 1 each by Does not apply route every 14 (fourteen) days.  . famotidine  (PEPCID) 20 MG tablet Take 1 tablet (20 mg total) by mouth 2 (two) times daily as needed for heartburn or indigestion.  . fexofenadine (ALLEGRA) 180 MG tablet Take 1 tablet (180 mg total) by mouth daily. For allergy symptoms  . glucose blood (ONETOUCH VERIO) test strip TEST QID Dx E10.9  . insulin aspart (NOVOLOG) 100 UNIT/ML injection Inject 12-50 Units into the skin 3 (three) times daily with meals. SLIDING SCALE IF ABOVE 150-2 UNITS EVERY 50  . insulin detemir (LEVEMIR) 100 UNIT/ML injection Inject 0.3 mLs (30 Units total) into the skin at bedtime.  Marland Kitchen lisinopril (PRINIVIL,ZESTRIL) 20 MG tablet Take 1 tablet (20 mg total) by mouth daily.  . montelukast (SINGULAIR) 10 MG tablet Take 1 tablet (10 mg total) by mouth at bedtime.  Letta Pate DELICA LANCETS 33G MISC Test BS QID and PRN dx E11.9  . SYRINGE-NEEDLE, DISP, 3 ML (MONOJECT 3CC SYR 27GX1-1/4") 27G X 1-1/4" 3 ML MISC Use up to QID and as needed  . Syringe/Needle, Disp, (SYRINGE 3CC/25GX5/8") 25G X 5/8" 3 ML MISC 1 each by Does not apply route 4 (four) times daily.   No facility-administered encounter medications on file as of 07/29/2018.     Review of Systems  Constitutional: Negative for chills and fever.  Eyes: Negative for visual disturbance.  Respiratory: Negative for shortness of breath and wheezing.   Cardiovascular: Negative for chest pain and leg swelling.  Musculoskeletal: Negative for back pain and gait problem.  Skin: Negative for rash.  Neurological: Negative for dizziness, tremors, weakness, light-headedness and numbness.  All other systems reviewed and are negative.   Observations/Objective: Patient sounds comfortable and in no acute distress on the phone  Assessment and Plan: Problem List Items Addressed This Visit      Endocrine   Mixed diabetic hyperlipidemia associated with type 1 diabetes mellitus (HCC) - Primary    Other Visit Diagnoses    Type 1 diabetes mellitus with hyperglycemia (HCC)            Follow Up Instructions: Sounds like it went up for a couple days, he may have had a bad absorption spot but it is come down over the past couple days, he went to the ER with no signs of infection and he denies any signs of infection so we will monitor for now and he will send me a message through my chart in a week or call me if it is not going good in a week.  Continue current dose for now because it seems like it is going well of Levemir 30 and NovoLog 12 3 times daily with sliding scale   I discussed the assessment and treatment plan with the patient. The patient was provided an opportunity to ask questions and all were answered. The patient agreed with the plan and demonstrated an understanding of the instructions.   The patient was advised to call back or seek an in-person evaluation if the symptoms worsen or if the condition fails to improve as anticipated.  The above assessment and management plan was discussed with the patient. The patient verbalized understanding of and has agreed to the management plan. Patient is aware to call the clinic if symptoms persist or worsen. Patient is aware when to return to the clinic for a follow-up visit. Patient educated on when it is appropriate to go to the emergency department.    I provided 10 minutes of non-face-to-face time during this encounter.    Nils PyleJoshua A Nilza Eaker, MD

## 2018-08-22 ENCOUNTER — Emergency Department (HOSPITAL_COMMUNITY)
Admission: EM | Admit: 2018-08-22 | Discharge: 2018-08-23 | Disposition: A | Discharge status: Home / Self Care | Payer: Medicare Other | Attending: Emergency Medicine | Admitting: Emergency Medicine

## 2018-08-22 ENCOUNTER — Encounter (HOSPITAL_COMMUNITY): Payer: Self-pay | Admitting: Emergency Medicine

## 2018-08-22 ENCOUNTER — Ambulatory Visit (INDEPENDENT_AMBULATORY_CARE_PROVIDER_SITE_OTHER): Payer: Medicare Other | Admitting: *Deleted

## 2018-08-22 ENCOUNTER — Encounter: Payer: Self-pay | Admitting: *Deleted

## 2018-08-22 ENCOUNTER — Other Ambulatory Visit: Payer: Self-pay

## 2018-08-22 DIAGNOSIS — Z79899 Other long term (current) drug therapy: Secondary | ICD-10-CM | POA: Diagnosis not present

## 2018-08-22 DIAGNOSIS — T7411XA Adult physical abuse, confirmed, initial encounter: Secondary | ICD-10-CM | POA: Diagnosis not present

## 2018-08-22 DIAGNOSIS — Z0471 Encounter for examination and observation following alleged adult physical abuse: Secondary | ICD-10-CM | POA: Insufficient documentation

## 2018-08-22 DIAGNOSIS — E1059 Type 1 diabetes mellitus with other circulatory complications: Secondary | ICD-10-CM | POA: Insufficient documentation

## 2018-08-22 DIAGNOSIS — G8220 Paraplegia, unspecified: Secondary | ICD-10-CM | POA: Insufficient documentation

## 2018-08-22 DIAGNOSIS — Z03818 Encounter for observation for suspected exposure to other biological agents ruled out: Secondary | ICD-10-CM | POA: Insufficient documentation

## 2018-08-22 DIAGNOSIS — E1040 Type 1 diabetes mellitus with diabetic neuropathy, unspecified: Secondary | ICD-10-CM | POA: Insufficient documentation

## 2018-08-22 DIAGNOSIS — Z0000 Encounter for general adult medical examination without abnormal findings: Secondary | ICD-10-CM

## 2018-08-22 DIAGNOSIS — Z1159 Encounter for screening for other viral diseases: Secondary | ICD-10-CM | POA: Diagnosis not present

## 2018-08-22 DIAGNOSIS — I10 Essential (primary) hypertension: Secondary | ICD-10-CM | POA: Insufficient documentation

## 2018-08-22 DIAGNOSIS — T7491XA Unspecified adult maltreatment, confirmed, initial encounter: Secondary | ICD-10-CM

## 2018-08-22 DIAGNOSIS — R000 Tachycardia, unspecified: Secondary | ICD-10-CM | POA: Diagnosis not present

## 2018-08-22 DIAGNOSIS — Z20828 Contact with and (suspected) exposure to other viral communicable diseases: Secondary | ICD-10-CM | POA: Diagnosis not present

## 2018-08-22 LAB — SARS CORONAVIRUS 2 BY RT PCR (HOSPITAL ORDER, PERFORMED IN ~~LOC~~ HOSPITAL LAB): SARS Coronavirus 2: NEGATIVE

## 2018-08-22 MED ORDER — INSULIN ASPART 100 UNIT/ML ~~LOC~~ SOLN
12.0000 [IU] | Freq: Three times a day (TID) | SUBCUTANEOUS | Status: DC
  Administered 2018-08-23: 24 [IU] via SUBCUTANEOUS
  Filled 2018-08-22: qty 1

## 2018-08-22 MED ORDER — LISINOPRIL 10 MG PO TABS
20.0000 mg | ORAL_TABLET | Freq: Every day | ORAL | Status: DC
  Administered 2018-08-22 – 2018-08-23 (×2): 20 mg via ORAL
  Filled 2018-08-22 (×2): qty 2

## 2018-08-22 MED ORDER — FAMOTIDINE 20 MG PO TABS: 20.0000 mg | ORAL_TABLET | Freq: Two times a day (BID) | ORAL | Status: DC | PRN

## 2018-08-22 MED ORDER — INSULIN DETEMIR 100 UNIT/ML ~~LOC~~ SOLN
30.0000 [IU] | Freq: Every day | SUBCUTANEOUS | Status: DC
  Administered 2018-08-22: 30 [IU] via SUBCUTANEOUS
  Filled 2018-08-22 (×4): qty 0.3

## 2018-08-22 MED ORDER — AMITRIPTYLINE HCL 25 MG PO TABS
25.0000 mg | ORAL_TABLET | Freq: Every day | ORAL | Status: DC
  Administered 2018-08-22: 25 mg via ORAL
  Filled 2018-08-22 (×5): qty 1

## 2018-08-22 MED ORDER — AMLODIPINE BESYLATE 5 MG PO TABS
5.0000 mg | ORAL_TABLET | Freq: Every day | ORAL | Status: DC
  Administered 2018-08-22 – 2018-08-23 (×2): 5 mg via ORAL
  Filled 2018-08-22 (×2): qty 1

## 2018-08-22 MED ORDER — CARVEDILOL 12.5 MG PO TABS
25.0000 mg | ORAL_TABLET | Freq: Two times a day (BID) | ORAL | Status: DC
  Administered 2018-08-23: 25 mg via ORAL
  Filled 2018-08-22: qty 2

## 2018-08-22 MED ORDER — ATORVASTATIN CALCIUM 10 MG PO TABS
10.0000 mg | ORAL_TABLET | Freq: Every day | ORAL | Status: DC
  Administered 2018-08-22 – 2018-08-23 (×2): 10 mg via ORAL
  Filled 2018-08-22 (×2): qty 1

## 2018-08-22 NOTE — Progress Notes (Signed)
Consult request has been received. CSW attempting to follow up at present time  Ermagene Saidi M. Grey Schlauch LCSWA Transitions of Care  Clinical Social Worker  Ph: [phone] 
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
## 2018-08-22 NOTE — NC FL2 (Addendum)
[place] MEDICAID FL2 LEVEL OF CARE SCREENING TOOL     IDENTIFICATION  Patient Name: Brady Rios Birthdate: 1966/03/14 Sex: male Admission Date (Current Location): 08/22/2018  [facility] and [place] Number:  Aaron Edelman N/A Facility and Address:  [facility],  [address] [place] [address]      Provider Number: [id number]  Attending Physician Name and Address:  Raeford Razor, MD  Relative Name and Phone Number:  Maalik Teinert ph: [phone]    Current Level of Care: Hospital Recommended Level of Care: Skilled Nursing Facility, Nursing Facility Prior Approval Number:    Date Approved/Denied: 08/12/12 PASRR Number: [id number]  Discharge Plan: SNF    Current Diagnoses: Patient Active Problem List   Diagnosis Date Noted  . Blindness 05/04/2016  . Controlled type 1 diabetes mellitus (HCC) 07/22/2015  . Hypertension associated with diabetes (HCC) 07/21/2015  . Mixed diabetic hyperlipidemia associated with type 1 diabetes mellitus (HCC) 07/21/2015  . Insomnia 07/21/2015    Orientation RESPIRATION BLADDER Height & Weight     Self, Time, Situation, Place  Normal Continent Weight: 130 lb 1.1 oz (59 kg) Height:  5\' 4"  (162.6 cm)  BEHAVIORAL SYMPTOMS/MOOD NEUROLOGICAL BOWEL NUTRITION STATUS      Continent Diet(Cardiac Diet due to hypertension)  AMBULATORY STATUS COMMUNICATION OF NEEDS Skin   Extensive Assist(Can transfer from bed to toilet and bed to wheelchair) Verbally Normal                       Personal Care Assistance Level of Assistance  Bathing Bathing Assistance: Limited assistance   Dressing Assistance: Limited assistance     Functional Limitations Info  Sight Sight Info: Impaired        SPECIAL CARE FACTORS FREQUENCY  Blood pressure                    Contractures Contractures Info: Not present    Additional Factors Info                  Current Medications (08/22/2018):  This is the current hospital  active medication list Current Facility-Administered Medications  Medication Dose Route Frequency Provider Last Rate Last Dose  . amitriptyline (ELAVIL) tablet 25 mg  25 mg Oral QHS Raeford Razor, MD      . amLODipine (NORVASC) tablet 5 mg  5 mg Oral Daily Raeford Razor, MD   5 mg at 08/22/18 1941  . atorvastatin (LIPITOR) tablet 10 mg  10 mg Oral Daily Raeford Razor, MD   10 mg at 08/22/18 1942  . [START ON 08/23/2018] carvedilol (COREG) tablet 25 mg  25 mg Oral BID WC Raeford Razor, MD      . famotidine (PEPCID) tablet 20 mg  20 mg Oral BID PRN Raeford Razor, MD      . Melene Muller ON 08/23/2018] insulin aspart (novoLOG) injection 12-50 Units  12-50 Units Subcutaneous TID WC Raeford Razor, MD      . insulin detemir (LEVEMIR) injection 30 Units  30 Units Subcutaneous QHS Raeford Razor, MD      . lisinopril (ZESTRIL) tablet 20 mg  20 mg Oral Daily Raeford Razor, MD   20 mg at 08/22/18 1942   Current Outpatient Medications  Medication Sig Dispense Refill  . amitriptyline (ELAVIL) 25 MG tablet Take 1 tablet (25 mg total) by mouth at bedtime. 90 tablet 3  . amLODipine (NORVASC) 5 MG tablet Take 1 tablet (5 mg total) by mouth daily. 90 tablet 3  .  atorvastatin (LIPITOR) 10 MG tablet Take 1 tablet (10 mg total) by mouth daily. 90 tablet 3  . carvedilol (COREG) 25 MG tablet Take 1 tablet (25 mg total) by mouth 2 (two) times daily with a meal. 180 tablet 3  . Continuous Blood Gluc Receiver (FREESTYLE LIBRE 14 DAY READER) DEVI 1 each by Does not apply route 4 (four) times daily. 1 Device 1  . Continuous Blood Gluc Sensor (FREESTYLE LIBRE 14 DAY SENSOR) MISC 1 each by Does not apply route every 14 (fourteen) days. 7 each 3  . famotidine (PEPCID) 20 MG tablet Take 1 tablet (20 mg total) by mouth 2 (two) times daily as needed for heartburn or indigestion. 180 tablet 3  . fexofenadine (ALLEGRA) 180 MG tablet Take 1 tablet (180 mg total) by mouth daily. For allergy symptoms 90 tablet 3  . glucose blood  (ONETOUCH VERIO) test strip TEST QID Dx E10.9 400 each 3  . insulin aspart (NOVOLOG) 100 UNIT/ML injection Inject 12-50 Units into the skin 3 (three) times daily with meals. SLIDING SCALE IF ABOVE 150-2 UNITS EVERY 50 100 mL 2  . insulin detemir (LEVEMIR) 100 UNIT/ML injection Inject 0.3 mLs (30 Units total) into the skin at bedtime. 30 mL 3  . lisinopril (PRINIVIL,ZESTRIL) 20 MG tablet Take 1 tablet (20 mg total) by mouth daily. 90 tablet 3  . montelukast (SINGULAIR) 10 MG tablet Take 1 tablet (10 mg total) by mouth at bedtime. 90 tablet 3  . ONETOUCH DELICA LANCETS 33G MISC Test BS QID and PRN dx E11.9 400 each 3  . SYRINGE-NEEDLE, DISP, 3 ML (MONOJECT 3CC SYR 27GX1-1/4") 27G X 1-1/4" 3 ML MISC Use up to QID and as needed 400 each 3  . Syringe/Needle, Disp, (SYRINGE 3CC/25GX5/8") 25G X 5/8" 3 ML MISC 1 each by Does not apply route 4 (four) times daily. 400 each 3     Discharge Medications: Please see discharge summary for a list of discharge medications.  Relevant Imaging Results:  Relevant Lab Results:   Additional Information SS# [id number]  Kenadie Royce Sherryle LisM Vernal Rutan, [place]
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
## 2018-08-22 NOTE — Patient Instructions (Signed)
  Brady Rios , Thank you for taking time to talk with me for your Medicare Wellness Visit. I appreciate your ongoing commitment to your health goals. Please review the following plan we discussed and let me know if I can assist you in the future.   These are the goals we discussed: Goals    . Exercise 3x per week (30 min per time)     Continue leg lifts, add dumbbell exercises for upper body strengthening            This is a list of the screening recommended for you and due dates:  Health Maintenance  Topic Date Due  . Pneumococcal vaccine  06/01/1966  . Complete foot exam   02/05/2018  . Colon Cancer Screening  08/08/2033*  . Flu Shot  11/09/2018  . Hemoglobin A1C  12/11/2018  . HIV Screening  Completed  . Eye exam for diabetics  Discontinued  . Tetanus Vaccine  Discontinued  *Topic was postponed. The date shown is not the original due date.

## 2018-08-22 NOTE — Progress Notes (Signed)
MEDICARE ANNUAL WELLNESS VISIT  08/22/2018  Telephone Visit Disclaimer This Medicare AWV was conducted by telephone due to national recommendations for restrictions regarding the COVID-19 Pandemic (e.g. social distancing).  I verified, using two identifiers, that I am speaking with Brady B. Null or their authorized healthcare agent. I discussed the limitations, risks, security, and privacy concerns of performing an evaluation and management service by telephone and the potential availability of an in-person appointment in the future. The patient expressed understanding and agreed to proceed.   Subjective:  Brady Rios is a 53 y.o. male patient of Dettinger, Elige Radon, MD who had a Medicare Annual Wellness Visit today via telephone. Brady is Disabled and lives with his wife. He does not have children. he reports that he is socially active and does interact with friends/family regularly.  He states before the COVID 19 restrictions he was going to a local nursing home to sing for the residents twice per month.  He is wheelchair bound, but is able to transfer in and out of chair on his own.  He is minimally physically active and enjoys singing and watching television.  Patient Care Team: Dettinger, Elige Radon, MD as PCP - General (Family Medicine)  Advanced Directives 08/22/2018 07/26/2018 08/15/2016  Does Patient Have a Medical Advance Directive? No No No  Would patient like information on creating a medical advance directive? No - Patient declined No - Patient declined No - Patient declined    Hospital Utilization Over the Past 12 Months: # of hospitalizations or ER visits: 1- ER visit for hyperglycemia # of surgeries: 0  Review of Systems    Patient reports that his overall health is unchanged compared to last year.    Review of Systems:   All systems negative as reported by patient.  Pain Assessment Pain Score: 0-No pain     Current Medications & Allergies (verified)  Allergies as of 08/22/2018   No Known Allergies     Medication List       Accurate as of Aug 22, 2018  3:43 PM. If you have any questions, ask your nurse or doctor.        amitriptyline 25 MG tablet Commonly known as:  ELAVIL Take 1 tablet (25 mg total) by mouth at bedtime.   amLODipine 5 MG tablet Commonly known as:  NORVASC Take 1 tablet (5 mg total) by mouth daily.   atorvastatin 10 MG tablet Commonly known as:  LIPITOR Take 1 tablet (10 mg total) by mouth daily.   carvedilol 25 MG tablet Commonly known as:  COREG Take 1 tablet (25 mg total) by mouth 2 (two) times daily with a meal.   famotidine 20 MG tablet Commonly known as:  PEPCID Take 1 tablet (20 mg total) by mouth 2 (two) times daily as needed for heartburn or indigestion.   fexofenadine 180 MG tablet Commonly known as:  ALLEGRA Take 1 tablet (180 mg total) by mouth daily. For allergy symptoms   FreeStyle Libre 14 Day Reader Hardie Pulley 1 each by Does not apply route 4 (four) times daily.   FreeStyle Libre 14 Day Sensor Misc 1 each by Does not apply route every 14 (fourteen) days.   glucose blood test strip Commonly known as:  OneTouch Verio TEST QID Dx E10.9   insulin aspart 100 UNIT/ML injection Commonly known as:  NovoLOG Inject 12-50 Units into the skin 3 (three) times daily with meals. SLIDING SCALE IF ABOVE 150-2 UNITS EVERY 50   insulin  detemir 100 UNIT/ML injection Commonly known as:  Levemir Inject 0.3 mLs (30 Units total) into the skin at bedtime.   lisinopril 20 MG tablet Commonly known as:  ZESTRIL Take 1 tablet (20 mg total) by mouth daily.   montelukast 10 MG tablet Commonly known as:  SINGULAIR Take 1 tablet (10 mg total) by mouth at bedtime.   OneTouch Delica Lancets 33G Misc Test BS QID and PRN dx E11.9   SYRINGE 3CC/25GX5/8" 25G X 5/8" 3 ML Misc 1 each by Does not apply route 4 (four) times daily.   SYRINGE-NEEDLE (DISP) 3 ML 27G X 1-1/4" 3 ML Misc Commonly known as:  MONOJECT  3CC SYR 27GX1-1/4" Use up to QID and as needed       History (reviewed): Past Medical History:  Diagnosis Date  . Allergy   . Diabetes mellitus without complication (HCC) 1997   type 1  . GERD (gastroesophageal reflux disease)   . Hyperlipidemia   . Hypertension   . Neuropathy   . Optic atrophy   . Paralysis (HCC)    from a viral infection in young child hood. has some sensation in lower extremity but unable to stand or walk   Past Surgical History:  Procedure Laterality Date  . LEG SURGERY     as a child to try to correct legs   Family History  Problem Relation Age of Onset  . Alzheimer's disease Mother   . Hypertension Father   . Pneumonia Father   . Cancer Brother        gall bladder and liver  . Cancer Maternal Grandmother        cervical  . Cancer Maternal Grandfather        stomach  . Cancer Paternal Grandfather        colon   Social History   Socioeconomic History  . Marital status: Married    Spouse name: Not on file  . Number of children: Not on file  . Years of education: 6613  . Highest education level: Associate degree: academic program  Occupational History  . Occupation: Disabled  Social Needs  . Financial resource strain: Not hard at all  . Food insecurity:    Worry: Never true    Inability: Never true  . Transportation needs:    Medical: No    Non-medical: No  Tobacco Use  . Smoking status: Never Smoker  . Smokeless tobacco: Never Used  Substance and Sexual Activity  . Alcohol use: No  . Drug use: No  . Sexual activity: Yes    Comment: with spouse since 2015  Lifestyle  . Physical activity:    Days per week: 0 days    Minutes per session: 0 min  . Stress: Only a little  Relationships  . Social connections:    Talks on phone: More than three times a week    Gets together: More than three times a week    Attends religious service: Never    Active member of club or organization: No    Attends meetings of clubs or organizations:  Never    Relationship status: Married  Other Topics Concern  . Not on file  Social History Narrative  . Not on file    Activities of Daily Living In your present state of health, do you have any difficulty performing the following activities: 08/22/2018  Hearing? N  Vision? Y  Comment blindness  Difficulty concentrating or making decisions? N  Walking or climbing stairs? YJeannie Fend  Comment Wheelchair bound   Dressing or bathing? N  Doing errands, shopping? Y  Comment mother in law provides Wellsite geologist and eating ? Y  Comment Wife cooks   Using the Toilet? N  In the past six months, have you accidently leaked urine? N  Do you have problems with loss of bowel control? N  Managing your Medications? Y  Comment Wife manages medications  Managing your Finances? Y  Comment Wife manages  Housekeeping or managing your Housekeeping? Y  Comment Wife does most house work  Some recent data might be hidden        Exercise Current Exercise Habits: Home exercise routine, Type of exercise: strength training/weights, Time (Minutes): 10, Frequency (Times/Week): 7, Weekly Exercise (Minutes/Week): 70, Intensity: Mild, Exercise limited by: neurologic condition(s);Other - see comments(Wheelchair bound)  Diet Patient reports consuming 3 meals a day and 1 snack(s) a day Patient reports that his primary diet is: Regular Patient reports that she does have regular access to food.   Depression Screen PHQ 2/9 Scores 08/22/2018 06/10/2018 05/01/2018 04/24/2018 03/04/2018 11/19/2017 05/21/2017  PHQ - 2 Score 0 0 0 0 0 0 0  PHQ- 9 Score - - - 0 - - -     Fall Risk Fall Risk  08/22/2018 04/24/2018 11/19/2017 10/20/2015  Falls in the past year? 0 0 No No     Objective:  Brady B. Calleros seemed alert and oriented and he participated appropriately during our telephone visit.  Blood Pressure Weight BMI  BP Readings from Last 3 Encounters:  07/26/18 (!) 142/81  06/10/18 140/78  05/01/18 (!)  149/84   Wt Readings from Last 3 Encounters:  07/26/18 130 lb (59 kg)   BMI Readings from Last 1 Encounters:  07/26/18 22.31 kg/m    *Unable to obtain current vital signs, weight, and BMI due to telephone visit type  Hearing/Vision  . Brady did not seem to have difficulty with hearing/understanding during the telephone conversation . Reports that he has not had a formal eye exam by an eye care professional within the past year . Reports that he has not had a formal hearing evaluation within the past year *Unable to fully assess hearing and vision during telephone visit type  Cognitive Function: 6CIT Screen 08/22/2018  What Year? 0 points  What month? 0 points  What time? 0 points  Count back from 20 0 points  Months in reverse 0 points  Repeat phrase 0 points  Total Score 0    Normal Cognitive Function Screening: Yes (Normal:0-7, Significant for Dysfunction: >8)  Immunization & Health Maintenance Record  There is no immunization history on file for this patient.  Health Maintenance  Topic Date Due  . PNEUMOCOCCAL POLYSACCHARIDE VACCINE AGE 80-64 HIGH RISK  06/01/1966  . FOOT EXAM  02/05/2018  . COLONOSCOPY  08/08/2033 (Originally 06/01/2014)  . INFLUENZA VACCINE  11/09/2018  . HEMOGLOBIN A1C  12/11/2018  . HIV Screening  Completed  . OPHTHALMOLOGY EXAM  Discontinued  . TETANUS/TDAP  Discontinued       Assessment  This is a routine wellness examination for Brady B. Doolen.  Health Maintenance: Due or Overdue Health Maintenance Due  Topic Date Due  . PNEUMOCOCCAL POLYSACCHARIDE VACCINE AGE 80-64 HIGH RISK  06/01/1966  . FOOT EXAM  02/05/2018   Recommended pneumovax 23 and foot exam at next in person office visit with Dr. Louanne Skye. Shingrix series also recommended.  Brady B. Eckart does not need a referral for Community Assistance: Care Management:  no Social Work:    no Prescription Assistance:  no Nutrition/Diabetes Education:  no   Plan:  Personalized  Goals Goals Addressed            This Visit's Progress   . Exercise 3x per week (30 min per time)       Continue leg lifts, add dumbbell exercises for upper body strengthening      Personalized Health Maintenance & Screening Recommendations  Pneumococcal vaccine  Advanced directives: has NO advanced directive - not interested in additional information  Lung Cancer Screening Recommended: no (Low Dose CT Chest recommended if Age 10-80 years, 30 pack-year currently smoking OR have quit w/in past 15 years) Hepatitis C Screening recommended: not applicable   Advanced Directives: Written information was not prepared per patient's request.  Referrals & Orders n/a  Follow-up Plan . Follow-up with Dettinger, Elige Radon, MD as planned . Work on increasing exercise to 3 times per week for 30 minutes    I have personally reviewed and noted the following in the patient's chart:   . Medical and social history . Use of alcohol, tobacco or illicit drugs  . Current medications and supplements . Functional ability and status . Nutritional status . Physical activity . Advanced directives . List of other physicians . Hospitalizations, surgeries, and ER visits in previous 12 months . Vitals . Screenings to include cognitive, depression, and falls . Referrals and appointments  In addition, I have reviewed and discussed with Brady B. Zamarripa certain preventive protocols, quality metrics, and best practice recommendations. A written personalized care plan for preventive services as well as general preventive health recommendations is available and can be mailed to the patient at his request.      Lilia Argue, RN  08/22/2018
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
## 2018-08-23 DIAGNOSIS — K219 Gastro-esophageal reflux disease without esophagitis: Secondary | ICD-10-CM | POA: Diagnosis not present

## 2018-08-23 DIAGNOSIS — I10 Essential (primary) hypertension: Secondary | ICD-10-CM | POA: Diagnosis not present

## 2018-08-23 DIAGNOSIS — E1040 Type 1 diabetes mellitus with diabetic neuropathy, unspecified: Secondary | ICD-10-CM | POA: Diagnosis not present

## 2018-08-23 DIAGNOSIS — N529 Male erectile dysfunction, unspecified: Secondary | ICD-10-CM | POA: Diagnosis not present

## 2018-08-23 DIAGNOSIS — D649 Anemia, unspecified: Secondary | ICD-10-CM | POA: Diagnosis not present

## 2018-08-23 DIAGNOSIS — E1140 Type 2 diabetes mellitus with diabetic neuropathy, unspecified: Secondary | ICD-10-CM | POA: Diagnosis not present

## 2018-08-23 DIAGNOSIS — E109 Type 1 diabetes mellitus without complications: Secondary | ICD-10-CM | POA: Diagnosis not present

## 2018-08-23 DIAGNOSIS — M62838 Other muscle spasm: Secondary | ICD-10-CM | POA: Diagnosis not present

## 2018-08-23 DIAGNOSIS — R5381 Other malaise: Secondary | ICD-10-CM | POA: Diagnosis not present

## 2018-08-23 DIAGNOSIS — E1059 Type 1 diabetes mellitus with other circulatory complications: Secondary | ICD-10-CM | POA: Diagnosis not present

## 2018-08-23 DIAGNOSIS — E119 Type 2 diabetes mellitus without complications: Secondary | ICD-10-CM | POA: Diagnosis not present

## 2018-08-23 DIAGNOSIS — R739 Hyperglycemia, unspecified: Secondary | ICD-10-CM | POA: Diagnosis not present

## 2018-08-23 DIAGNOSIS — J309 Allergic rhinitis, unspecified: Secondary | ICD-10-CM | POA: Diagnosis not present

## 2018-08-23 DIAGNOSIS — Z7401 Bed confinement status: Secondary | ICD-10-CM | POA: Diagnosis not present

## 2018-08-23 DIAGNOSIS — E0801 Diabetes mellitus due to underlying condition with hyperosmolarity with coma: Secondary | ICD-10-CM | POA: Diagnosis not present

## 2018-08-23 DIAGNOSIS — G4700 Insomnia, unspecified: Secondary | ICD-10-CM | POA: Diagnosis not present

## 2018-08-23 DIAGNOSIS — M6281 Muscle weakness (generalized): Secondary | ICD-10-CM | POA: Diagnosis not present

## 2018-08-23 DIAGNOSIS — G8220 Paraplegia, unspecified: Secondary | ICD-10-CM | POA: Diagnosis not present

## 2018-08-23 DIAGNOSIS — E785 Hyperlipidemia, unspecified: Secondary | ICD-10-CM | POA: Diagnosis not present

## 2018-08-23 DIAGNOSIS — R29898 Other symptoms and signs involving the musculoskeletal system: Secondary | ICD-10-CM | POA: Diagnosis not present

## 2018-08-23 DIAGNOSIS — Z03818 Encounter for observation for suspected exposure to other biological agents ruled out: Secondary | ICD-10-CM | POA: Diagnosis not present

## 2018-08-23 DIAGNOSIS — F5101 Primary insomnia: Secondary | ICD-10-CM | POA: Diagnosis not present

## 2018-08-23 DIAGNOSIS — F4321 Adjustment disorder with depressed mood: Secondary | ICD-10-CM | POA: Diagnosis not present

## 2018-08-23 DIAGNOSIS — Z1159 Encounter for screening for other viral diseases: Secondary | ICD-10-CM | POA: Diagnosis not present

## 2018-08-23 DIAGNOSIS — Z0471 Encounter for examination and observation following alleged adult physical abuse: Secondary | ICD-10-CM | POA: Diagnosis not present

## 2018-08-23 DIAGNOSIS — G609 Hereditary and idiopathic neuropathy, unspecified: Secondary | ICD-10-CM | POA: Diagnosis not present

## 2018-08-23 LAB — CBG MONITORING, ED
Glucose-Capillary: 143 mg/dL — ABNORMAL HIGH (ref 70–99)
Glucose-Capillary: 387 mg/dL — ABNORMAL HIGH (ref 70–99)

## 2018-08-23 NOTE — ED Notes (Signed)
Pt resting at this time.

## 2018-08-23 NOTE — Discharge Instructions (Addendum)
Please monitor your condition carefully and return here for any concerning changes.  Otherwise, please follow-up with your physician.

## 2018-08-23 NOTE — Progress Notes (Signed)
CSW 2nd shift ED CSW received a call from Admission Staff at [facility] (Ph: [phone]) to receive update on Pt's discharge status. CSW informed pt that once d/c summary and d/c med list was completed, that it would be faxed to facility and would then proceed with transportation.   CSW will continue to follow up regarding this matter  Brady Rios Tomma Rakers Transitions of Care  Clinical Social Worker  Ph: [phone]
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
## 2018-08-23 NOTE — ED Notes (Signed)
Patient is resting comfortably at this time

## 2018-08-23 NOTE — Clinical Social Work Note (Signed)
Pt identified [facility] as his first choice for placement as he has been there previously.  Sent info. And spoke with Kendal Hymen in admissions, who is reviewing application and speaking with DSS re: payor source.

## 2018-08-23 NOTE — TOC Transition Note (Signed)
Transition of Care (TOC) - CM/SW Discharge Note   Patient Details  Name: Brady Rios MRN: [id number] Date of Birth: 1965/11/14  Transition of Care [facility]) CM/SW Contact:  Ida Rogue, LCSW Phone Number: 08/23/2018, 2:00 PM   Clinical Narrative:   Pt to transfer to [facility] today.  Transportation by [employer]. Pt has wheelchair.  CSW filled out Med necessity form.  Nursing call report to [phone].    Final next level of care: Skilled Nursing Facility Barriers to Discharge: No Barriers Identified   Patient Goals and CMS Choice Patient states their goals for this hospitalization and ongoing recovery are:: to discharge to SNF for long term placement  CMS Medicare.gov Compare Post Acute Care list provided to:: Patient Choice offered to / list presented to : Patient  Discharge Placement   Existing PASRR number confirmed : 08/22/18          Patient [facility] bed at: [facility] Patient to be transferred to facility by: [facility] Name of family member notified: none Patient and family notified of of transfer: 08/23/18  Discharge Plan and Services In-house Referral: Clinical Social Work   Post Acute Care Choice: Skilled Nursing Facility, Nursing Home                               Social Determinants of Health (SDOH) Interventions     Readmission Risk Interventions No flowsheet data found.
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
## 2018-08-27 ENCOUNTER — Telehealth: Payer: Self-pay | Admitting: Family Medicine

## 2018-08-27 DIAGNOSIS — J309 Allergic rhinitis, unspecified: Secondary | ICD-10-CM | POA: Diagnosis not present

## 2018-08-27 DIAGNOSIS — E785 Hyperlipidemia, unspecified: Secondary | ICD-10-CM | POA: Diagnosis not present

## 2018-08-27 DIAGNOSIS — E119 Type 2 diabetes mellitus without complications: Secondary | ICD-10-CM | POA: Diagnosis not present

## 2018-08-27 DIAGNOSIS — G4700 Insomnia, unspecified: Secondary | ICD-10-CM | POA: Diagnosis not present

## 2018-08-27 DIAGNOSIS — I10 Essential (primary) hypertension: Secondary | ICD-10-CM | POA: Diagnosis not present

## 2018-08-27 DIAGNOSIS — K219 Gastro-esophageal reflux disease without esophagitis: Secondary | ICD-10-CM | POA: Diagnosis not present

## 2018-08-27 DIAGNOSIS — M6281 Muscle weakness (generalized): Secondary | ICD-10-CM | POA: Diagnosis not present

## 2018-08-28 NOTE — Telephone Encounter (Signed)
Thanks for the information, I can figure this might happen when I saw the ER note.

## 2018-08-29 DIAGNOSIS — K219 Gastro-esophageal reflux disease without esophagitis: Secondary | ICD-10-CM | POA: Diagnosis not present

## 2018-08-29 DIAGNOSIS — E785 Hyperlipidemia, unspecified: Secondary | ICD-10-CM | POA: Diagnosis not present

## 2018-08-29 DIAGNOSIS — E119 Type 2 diabetes mellitus without complications: Secondary | ICD-10-CM | POA: Diagnosis not present

## 2018-08-29 DIAGNOSIS — G4700 Insomnia, unspecified: Secondary | ICD-10-CM | POA: Diagnosis not present

## 2018-08-29 DIAGNOSIS — I10 Essential (primary) hypertension: Secondary | ICD-10-CM | POA: Diagnosis not present

## 2018-08-29 DIAGNOSIS — J309 Allergic rhinitis, unspecified: Secondary | ICD-10-CM | POA: Diagnosis not present

## 2018-08-29 DIAGNOSIS — M6281 Muscle weakness (generalized): Secondary | ICD-10-CM | POA: Diagnosis not present

## 2018-09-03 DIAGNOSIS — J309 Allergic rhinitis, unspecified: Secondary | ICD-10-CM | POA: Diagnosis not present

## 2018-09-03 DIAGNOSIS — G4700 Insomnia, unspecified: Secondary | ICD-10-CM | POA: Diagnosis not present

## 2018-09-03 DIAGNOSIS — M6281 Muscle weakness (generalized): Secondary | ICD-10-CM | POA: Diagnosis not present

## 2018-09-03 DIAGNOSIS — E119 Type 2 diabetes mellitus without complications: Secondary | ICD-10-CM | POA: Diagnosis not present

## 2018-09-03 DIAGNOSIS — I10 Essential (primary) hypertension: Secondary | ICD-10-CM | POA: Diagnosis not present

## 2018-09-03 DIAGNOSIS — K219 Gastro-esophageal reflux disease without esophagitis: Secondary | ICD-10-CM | POA: Diagnosis not present

## 2018-09-03 DIAGNOSIS — E785 Hyperlipidemia, unspecified: Secondary | ICD-10-CM | POA: Diagnosis not present

## 2018-09-05 DIAGNOSIS — E785 Hyperlipidemia, unspecified: Secondary | ICD-10-CM | POA: Diagnosis not present

## 2018-09-05 DIAGNOSIS — I10 Essential (primary) hypertension: Secondary | ICD-10-CM | POA: Diagnosis not present

## 2018-09-05 DIAGNOSIS — E119 Type 2 diabetes mellitus without complications: Secondary | ICD-10-CM | POA: Diagnosis not present

## 2018-09-05 DIAGNOSIS — J309 Allergic rhinitis, unspecified: Secondary | ICD-10-CM | POA: Diagnosis not present

## 2018-09-05 DIAGNOSIS — K219 Gastro-esophageal reflux disease without esophagitis: Secondary | ICD-10-CM | POA: Diagnosis not present

## 2018-09-05 DIAGNOSIS — G4700 Insomnia, unspecified: Secondary | ICD-10-CM | POA: Diagnosis not present

## 2018-09-05 DIAGNOSIS — M6281 Muscle weakness (generalized): Secondary | ICD-10-CM | POA: Diagnosis not present

## 2018-09-10 DIAGNOSIS — E785 Hyperlipidemia, unspecified: Secondary | ICD-10-CM | POA: Diagnosis not present

## 2018-09-10 DIAGNOSIS — I10 Essential (primary) hypertension: Secondary | ICD-10-CM | POA: Diagnosis not present

## 2018-09-10 DIAGNOSIS — E119 Type 2 diabetes mellitus without complications: Secondary | ICD-10-CM | POA: Diagnosis not present

## 2018-09-10 DIAGNOSIS — M6281 Muscle weakness (generalized): Secondary | ICD-10-CM | POA: Diagnosis not present

## 2018-09-10 DIAGNOSIS — G4700 Insomnia, unspecified: Secondary | ICD-10-CM | POA: Diagnosis not present

## 2018-09-10 DIAGNOSIS — K219 Gastro-esophageal reflux disease without esophagitis: Secondary | ICD-10-CM | POA: Diagnosis not present

## 2018-09-10 DIAGNOSIS — J309 Allergic rhinitis, unspecified: Secondary | ICD-10-CM | POA: Diagnosis not present

## 2018-09-16 ENCOUNTER — Ambulatory Visit: Admitting: Family Medicine

## 2018-09-17 DIAGNOSIS — K219 Gastro-esophageal reflux disease without esophagitis: Secondary | ICD-10-CM | POA: Diagnosis not present

## 2018-09-17 DIAGNOSIS — E119 Type 2 diabetes mellitus without complications: Secondary | ICD-10-CM | POA: Diagnosis not present

## 2018-09-17 DIAGNOSIS — J309 Allergic rhinitis, unspecified: Secondary | ICD-10-CM | POA: Diagnosis not present

## 2018-09-17 DIAGNOSIS — E785 Hyperlipidemia, unspecified: Secondary | ICD-10-CM | POA: Diagnosis not present

## 2018-09-17 DIAGNOSIS — G4700 Insomnia, unspecified: Secondary | ICD-10-CM | POA: Diagnosis not present

## 2018-09-17 DIAGNOSIS — I10 Essential (primary) hypertension: Secondary | ICD-10-CM | POA: Diagnosis not present

## 2018-09-17 DIAGNOSIS — M6281 Muscle weakness (generalized): Secondary | ICD-10-CM | POA: Diagnosis not present

## 2018-09-26 DIAGNOSIS — G4700 Insomnia, unspecified: Secondary | ICD-10-CM | POA: Diagnosis not present

## 2018-09-26 DIAGNOSIS — J309 Allergic rhinitis, unspecified: Secondary | ICD-10-CM | POA: Diagnosis not present

## 2018-09-26 DIAGNOSIS — K219 Gastro-esophageal reflux disease without esophagitis: Secondary | ICD-10-CM | POA: Diagnosis not present

## 2018-09-26 DIAGNOSIS — E785 Hyperlipidemia, unspecified: Secondary | ICD-10-CM | POA: Diagnosis not present

## 2018-09-26 DIAGNOSIS — E119 Type 2 diabetes mellitus without complications: Secondary | ICD-10-CM | POA: Diagnosis not present

## 2018-09-26 DIAGNOSIS — I10 Essential (primary) hypertension: Secondary | ICD-10-CM | POA: Diagnosis not present

## 2018-09-26 DIAGNOSIS — M6281 Muscle weakness (generalized): Secondary | ICD-10-CM | POA: Diagnosis not present

## 2018-10-01 DIAGNOSIS — R739 Hyperglycemia, unspecified: Secondary | ICD-10-CM | POA: Diagnosis not present

## 2018-10-01 DIAGNOSIS — E119 Type 2 diabetes mellitus without complications: Secondary | ICD-10-CM | POA: Diagnosis not present

## 2018-10-03 DIAGNOSIS — F5101 Primary insomnia: Secondary | ICD-10-CM | POA: Diagnosis not present

## 2018-10-15 DIAGNOSIS — E1140 Type 2 diabetes mellitus with diabetic neuropathy, unspecified: Secondary | ICD-10-CM | POA: Diagnosis not present

## 2018-10-30 DIAGNOSIS — E785 Hyperlipidemia, unspecified: Secondary | ICD-10-CM | POA: Diagnosis not present

## 2018-10-30 DIAGNOSIS — G4700 Insomnia, unspecified: Secondary | ICD-10-CM | POA: Diagnosis not present

## 2018-10-30 DIAGNOSIS — M62838 Other muscle spasm: Secondary | ICD-10-CM | POA: Diagnosis not present

## 2018-10-30 DIAGNOSIS — E109 Type 1 diabetes mellitus without complications: Secondary | ICD-10-CM | POA: Diagnosis not present

## 2018-10-30 DIAGNOSIS — D649 Anemia, unspecified: Secondary | ICD-10-CM | POA: Diagnosis not present

## 2018-10-30 DIAGNOSIS — I10 Essential (primary) hypertension: Secondary | ICD-10-CM | POA: Diagnosis not present

## 2018-10-30 DIAGNOSIS — G609 Hereditary and idiopathic neuropathy, unspecified: Secondary | ICD-10-CM | POA: Diagnosis not present

## 2018-10-30 DIAGNOSIS — K219 Gastro-esophageal reflux disease without esophagitis: Secondary | ICD-10-CM | POA: Diagnosis not present

## 2018-10-30 DIAGNOSIS — J309 Allergic rhinitis, unspecified: Secondary | ICD-10-CM | POA: Diagnosis not present

## 2018-10-30 DIAGNOSIS — M6281 Muscle weakness (generalized): Secondary | ICD-10-CM | POA: Diagnosis not present

## 2018-10-31 DIAGNOSIS — I10 Essential (primary) hypertension: Secondary | ICD-10-CM | POA: Diagnosis not present

## 2018-10-31 DIAGNOSIS — K219 Gastro-esophageal reflux disease without esophagitis: Secondary | ICD-10-CM | POA: Diagnosis not present

## 2018-10-31 DIAGNOSIS — F5101 Primary insomnia: Secondary | ICD-10-CM | POA: Diagnosis not present

## 2018-10-31 DIAGNOSIS — F4321 Adjustment disorder with depressed mood: Secondary | ICD-10-CM | POA: Diagnosis not present

## 2018-10-31 DIAGNOSIS — J309 Allergic rhinitis, unspecified: Secondary | ICD-10-CM | POA: Diagnosis not present

## 2018-10-31 DIAGNOSIS — E785 Hyperlipidemia, unspecified: Secondary | ICD-10-CM | POA: Diagnosis not present

## 2018-10-31 DIAGNOSIS — E119 Type 2 diabetes mellitus without complications: Secondary | ICD-10-CM | POA: Diagnosis not present

## 2018-11-07 DIAGNOSIS — F5101 Primary insomnia: Secondary | ICD-10-CM | POA: Diagnosis not present

## 2018-11-07 DIAGNOSIS — F4321 Adjustment disorder with depressed mood: Secondary | ICD-10-CM | POA: Diagnosis not present

## 2018-11-12 DIAGNOSIS — F5101 Primary insomnia: Secondary | ICD-10-CM | POA: Diagnosis not present

## 2018-11-13 DIAGNOSIS — Z20828 Contact with and (suspected) exposure to other viral communicable diseases: Secondary | ICD-10-CM | POA: Diagnosis not present

## 2018-11-14 DIAGNOSIS — F5101 Primary insomnia: Secondary | ICD-10-CM | POA: Diagnosis not present

## 2018-11-14 DIAGNOSIS — F4321 Adjustment disorder with depressed mood: Secondary | ICD-10-CM | POA: Diagnosis not present

## 2018-11-21 DIAGNOSIS — F4321 Adjustment disorder with depressed mood: Secondary | ICD-10-CM | POA: Diagnosis not present

## 2018-11-21 DIAGNOSIS — F5101 Primary insomnia: Secondary | ICD-10-CM | POA: Diagnosis not present

## 2018-11-27 DIAGNOSIS — F5101 Primary insomnia: Secondary | ICD-10-CM | POA: Diagnosis not present

## 2018-11-27 DIAGNOSIS — F4321 Adjustment disorder with depressed mood: Secondary | ICD-10-CM | POA: Diagnosis not present

## 2018-12-03 DIAGNOSIS — K219 Gastro-esophageal reflux disease without esophagitis: Secondary | ICD-10-CM | POA: Diagnosis not present

## 2018-12-03 DIAGNOSIS — F5101 Primary insomnia: Secondary | ICD-10-CM | POA: Diagnosis not present

## 2018-12-03 DIAGNOSIS — E785 Hyperlipidemia, unspecified: Secondary | ICD-10-CM | POA: Diagnosis not present

## 2018-12-03 DIAGNOSIS — J309 Allergic rhinitis, unspecified: Secondary | ICD-10-CM | POA: Diagnosis not present

## 2018-12-03 DIAGNOSIS — F4321 Adjustment disorder with depressed mood: Secondary | ICD-10-CM | POA: Diagnosis not present

## 2018-12-03 DIAGNOSIS — I10 Essential (primary) hypertension: Secondary | ICD-10-CM | POA: Diagnosis not present

## 2018-12-03 DIAGNOSIS — E119 Type 2 diabetes mellitus without complications: Secondary | ICD-10-CM | POA: Diagnosis not present

## 2018-12-05 DIAGNOSIS — F5101 Primary insomnia: Secondary | ICD-10-CM | POA: Diagnosis not present

## 2018-12-05 DIAGNOSIS — F4321 Adjustment disorder with depressed mood: Secondary | ICD-10-CM | POA: Diagnosis not present

## 2018-12-11 DIAGNOSIS — I739 Peripheral vascular disease, unspecified: Secondary | ICD-10-CM | POA: Diagnosis not present

## 2018-12-11 DIAGNOSIS — L603 Nail dystrophy: Secondary | ICD-10-CM | POA: Diagnosis not present

## 2018-12-11 DIAGNOSIS — Q845 Enlarged and hypertrophic nails: Secondary | ICD-10-CM | POA: Diagnosis not present

## 2018-12-11 DIAGNOSIS — B351 Tinea unguium: Secondary | ICD-10-CM | POA: Diagnosis not present

## 2018-12-19 DIAGNOSIS — F4321 Adjustment disorder with depressed mood: Secondary | ICD-10-CM | POA: Diagnosis not present

## 2018-12-19 DIAGNOSIS — F5101 Primary insomnia: Secondary | ICD-10-CM | POA: Diagnosis not present

## 2018-12-20 DIAGNOSIS — Z1159 Encounter for screening for other viral diseases: Secondary | ICD-10-CM | POA: Diagnosis not present

## 2018-12-27 DIAGNOSIS — Z1159 Encounter for screening for other viral diseases: Secondary | ICD-10-CM | POA: Diagnosis not present

## 2019-01-01 DIAGNOSIS — E785 Hyperlipidemia, unspecified: Secondary | ICD-10-CM | POA: Diagnosis not present

## 2019-01-01 DIAGNOSIS — J309 Allergic rhinitis, unspecified: Secondary | ICD-10-CM | POA: Diagnosis not present

## 2019-01-01 DIAGNOSIS — E118 Type 2 diabetes mellitus with unspecified complications: Secondary | ICD-10-CM | POA: Diagnosis not present

## 2019-01-01 DIAGNOSIS — E559 Vitamin D deficiency, unspecified: Secondary | ICD-10-CM | POA: Diagnosis not present

## 2019-01-01 DIAGNOSIS — M6281 Muscle weakness (generalized): Secondary | ICD-10-CM | POA: Diagnosis not present

## 2019-01-01 DIAGNOSIS — K219 Gastro-esophageal reflux disease without esophagitis: Secondary | ICD-10-CM | POA: Diagnosis not present

## 2019-01-01 DIAGNOSIS — I10 Essential (primary) hypertension: Secondary | ICD-10-CM | POA: Diagnosis not present

## 2019-01-01 DIAGNOSIS — G4700 Insomnia, unspecified: Secondary | ICD-10-CM | POA: Diagnosis not present

## 2019-01-02 DIAGNOSIS — J309 Allergic rhinitis, unspecified: Secondary | ICD-10-CM | POA: Diagnosis not present

## 2019-01-02 DIAGNOSIS — M6281 Muscle weakness (generalized): Secondary | ICD-10-CM | POA: Diagnosis not present

## 2019-01-02 DIAGNOSIS — E559 Vitamin D deficiency, unspecified: Secondary | ICD-10-CM | POA: Diagnosis not present

## 2019-01-02 DIAGNOSIS — E785 Hyperlipidemia, unspecified: Secondary | ICD-10-CM | POA: Diagnosis not present

## 2019-01-02 DIAGNOSIS — F4321 Adjustment disorder with depressed mood: Secondary | ICD-10-CM | POA: Diagnosis not present

## 2019-01-02 DIAGNOSIS — L603 Nail dystrophy: Secondary | ICD-10-CM | POA: Diagnosis not present

## 2019-01-02 DIAGNOSIS — F5101 Primary insomnia: Secondary | ICD-10-CM | POA: Diagnosis not present

## 2019-01-02 DIAGNOSIS — G4700 Insomnia, unspecified: Secondary | ICD-10-CM | POA: Diagnosis not present

## 2019-01-02 DIAGNOSIS — E118 Type 2 diabetes mellitus with unspecified complications: Secondary | ICD-10-CM | POA: Diagnosis not present

## 2019-01-02 DIAGNOSIS — I10 Essential (primary) hypertension: Secondary | ICD-10-CM | POA: Diagnosis not present

## 2019-01-02 DIAGNOSIS — B351 Tinea unguium: Secondary | ICD-10-CM | POA: Diagnosis not present

## 2019-01-02 DIAGNOSIS — K219 Gastro-esophageal reflux disease without esophagitis: Secondary | ICD-10-CM | POA: Diagnosis not present

## 2019-01-06 DIAGNOSIS — Z1159 Encounter for screening for other viral diseases: Secondary | ICD-10-CM | POA: Diagnosis not present

## 2019-01-09 DIAGNOSIS — I10 Essential (primary) hypertension: Secondary | ICD-10-CM | POA: Diagnosis not present

## 2019-01-09 DIAGNOSIS — E559 Vitamin D deficiency, unspecified: Secondary | ICD-10-CM | POA: Diagnosis not present

## 2019-01-09 DIAGNOSIS — E0829 Diabetes mellitus due to underlying condition with other diabetic kidney complication: Secondary | ICD-10-CM | POA: Diagnosis not present

## 2019-01-09 DIAGNOSIS — D513 Other dietary vitamin B12 deficiency anemia: Secondary | ICD-10-CM | POA: Diagnosis not present

## 2019-01-10 DIAGNOSIS — Z1159 Encounter for screening for other viral diseases: Secondary | ICD-10-CM | POA: Diagnosis not present

## 2019-01-14 DIAGNOSIS — Z1159 Encounter for screening for other viral diseases: Secondary | ICD-10-CM | POA: Diagnosis not present

## 2019-01-16 DIAGNOSIS — I10 Essential (primary) hypertension: Secondary | ICD-10-CM | POA: Diagnosis not present

## 2019-01-16 DIAGNOSIS — E118 Type 2 diabetes mellitus with unspecified complications: Secondary | ICD-10-CM | POA: Diagnosis not present

## 2019-01-16 DIAGNOSIS — G4700 Insomnia, unspecified: Secondary | ICD-10-CM | POA: Diagnosis not present

## 2019-01-16 DIAGNOSIS — F5101 Primary insomnia: Secondary | ICD-10-CM | POA: Diagnosis not present

## 2019-01-16 DIAGNOSIS — K219 Gastro-esophageal reflux disease without esophagitis: Secondary | ICD-10-CM | POA: Diagnosis not present

## 2019-01-16 DIAGNOSIS — M6281 Muscle weakness (generalized): Secondary | ICD-10-CM | POA: Diagnosis not present

## 2019-01-16 DIAGNOSIS — F4321 Adjustment disorder with depressed mood: Secondary | ICD-10-CM | POA: Diagnosis not present

## 2019-01-16 DIAGNOSIS — L603 Nail dystrophy: Secondary | ICD-10-CM | POA: Diagnosis not present

## 2019-01-16 DIAGNOSIS — B351 Tinea unguium: Secondary | ICD-10-CM | POA: Diagnosis not present

## 2019-01-16 DIAGNOSIS — J309 Allergic rhinitis, unspecified: Secondary | ICD-10-CM | POA: Diagnosis not present

## 2019-01-16 DIAGNOSIS — E559 Vitamin D deficiency, unspecified: Secondary | ICD-10-CM | POA: Diagnosis not present

## 2019-01-16 DIAGNOSIS — E785 Hyperlipidemia, unspecified: Secondary | ICD-10-CM | POA: Diagnosis not present

## 2019-01-17 DIAGNOSIS — Z1159 Encounter for screening for other viral diseases: Secondary | ICD-10-CM | POA: Diagnosis not present

## 2019-01-17 DIAGNOSIS — F5101 Primary insomnia: Secondary | ICD-10-CM | POA: Diagnosis not present

## 2019-01-21 DIAGNOSIS — Z1159 Encounter for screening for other viral diseases: Secondary | ICD-10-CM | POA: Diagnosis not present

## 2019-01-23 DIAGNOSIS — Z1159 Encounter for screening for other viral diseases: Secondary | ICD-10-CM | POA: Diagnosis not present

## 2019-01-27 DIAGNOSIS — Z1159 Encounter for screening for other viral diseases: Secondary | ICD-10-CM | POA: Diagnosis not present

## 2019-01-30 DIAGNOSIS — Z1159 Encounter for screening for other viral diseases: Secondary | ICD-10-CM | POA: Diagnosis not present

## 2019-02-03 DIAGNOSIS — Z1159 Encounter for screening for other viral diseases: Secondary | ICD-10-CM | POA: Diagnosis not present

## 2019-02-06 DIAGNOSIS — Z1159 Encounter for screening for other viral diseases: Secondary | ICD-10-CM | POA: Diagnosis not present

## 2019-02-10 DIAGNOSIS — L603 Nail dystrophy: Secondary | ICD-10-CM | POA: Diagnosis not present

## 2019-02-10 DIAGNOSIS — I739 Peripheral vascular disease, unspecified: Secondary | ICD-10-CM | POA: Diagnosis not present

## 2019-02-10 DIAGNOSIS — Z1159 Encounter for screening for other viral diseases: Secondary | ICD-10-CM | POA: Diagnosis not present

## 2019-02-10 DIAGNOSIS — Q845 Enlarged and hypertrophic nails: Secondary | ICD-10-CM | POA: Diagnosis not present

## 2019-02-10 DIAGNOSIS — B351 Tinea unguium: Secondary | ICD-10-CM | POA: Diagnosis not present

## 2019-02-13 DIAGNOSIS — Z1159 Encounter for screening for other viral diseases: Secondary | ICD-10-CM | POA: Diagnosis not present

## 2019-02-14 DIAGNOSIS — F5101 Primary insomnia: Secondary | ICD-10-CM | POA: Diagnosis not present

## 2019-02-17 DIAGNOSIS — Z1159 Encounter for screening for other viral diseases: Secondary | ICD-10-CM | POA: Diagnosis not present

## 2019-02-20 DIAGNOSIS — Z1159 Encounter for screening for other viral diseases: Secondary | ICD-10-CM | POA: Diagnosis not present

## 2019-02-24 DIAGNOSIS — Z1159 Encounter for screening for other viral diseases: Secondary | ICD-10-CM | POA: Diagnosis not present

## 2019-02-27 DIAGNOSIS — Z1159 Encounter for screening for other viral diseases: Secondary | ICD-10-CM | POA: Diagnosis not present

## 2019-03-03 DIAGNOSIS — Z1159 Encounter for screening for other viral diseases: Secondary | ICD-10-CM | POA: Diagnosis not present

## 2019-03-06 DIAGNOSIS — Z1159 Encounter for screening for other viral diseases: Secondary | ICD-10-CM | POA: Diagnosis not present

## 2019-03-10 DIAGNOSIS — Z1159 Encounter for screening for other viral diseases: Secondary | ICD-10-CM | POA: Diagnosis not present

## 2019-03-11 ENCOUNTER — Telehealth: Payer: Self-pay | Admitting: Family Medicine

## 2019-03-11 NOTE — Chronic Care Management (AMB) (Signed)
°  Chronic Care Management   Outreach Note  03/11/2019 Name: Brady Rios MRN: [id number] DOB: 01/08/1966  Referred by: Dettinger, Fransisca Kaufmann, MD Reason for referral : Chronic Care Management (Initial CCM outreach was unsuccessful. )   An unsuccessful telephone outreach was attempted today. The patient was referred to the case management team by for assistance with care management and care coordination.   Follow Up Plan: The care management team will reach out to the patient again over the next 7 days.   [place], [place] [address] Direct Dial: [email or web address]  Website: [email or web address]

## 2019-03-13 DIAGNOSIS — Z1159 Encounter for screening for other viral diseases: Secondary | ICD-10-CM | POA: Diagnosis not present

## 2019-03-17 DIAGNOSIS — Z1159 Encounter for screening for other viral diseases: Secondary | ICD-10-CM | POA: Diagnosis not present

## 2019-03-17 DIAGNOSIS — F5101 Primary insomnia: Secondary | ICD-10-CM | POA: Diagnosis not present

## 2019-03-17 NOTE — Chronic Care Management (AMB) (Signed)
°  Chronic Care Management   Outreach Note  03/17/2019 Name: Brady Rios MRN: [id number] DOB: 1965/11/28  Referred by: Dettinger, Fransisca Kaufmann, MD Reason for referral : Chronic Care Management (Initial CCM outreach was unsuccessful. )   Patient has been dismissed.   Follow Up Plan: No further follow up required  [place], [place], [place] [address] Direct Dial: [email or web address]  Website: [email or web address]  '

## 2019-03-20 DIAGNOSIS — Z1159 Encounter for screening for other viral diseases: Secondary | ICD-10-CM | POA: Diagnosis not present

## 2019-03-31 DIAGNOSIS — Z1159 Encounter for screening for other viral diseases: Secondary | ICD-10-CM | POA: Diagnosis not present

## 2019-04-14 DIAGNOSIS — F5101 Primary insomnia: Secondary | ICD-10-CM | POA: Diagnosis not present

## 2019-04-17 DIAGNOSIS — E119 Type 2 diabetes mellitus without complications: Secondary | ICD-10-CM | POA: Diagnosis not present

## 2019-04-24 DIAGNOSIS — Z1159 Encounter for screening for other viral diseases: Secondary | ICD-10-CM | POA: Diagnosis not present

## 2019-04-28 DIAGNOSIS — Z1159 Encounter for screening for other viral diseases: Secondary | ICD-10-CM | POA: Diagnosis not present

## 2019-04-29 DIAGNOSIS — E118 Type 2 diabetes mellitus with unspecified complications: Secondary | ICD-10-CM | POA: Diagnosis not present

## 2019-04-29 DIAGNOSIS — H548 Legal blindness, as defined in USA: Secondary | ICD-10-CM | POA: Diagnosis not present

## 2019-04-29 DIAGNOSIS — R739 Hyperglycemia, unspecified: Secondary | ICD-10-CM | POA: Diagnosis not present

## 2019-04-30 DIAGNOSIS — I10 Essential (primary) hypertension: Secondary | ICD-10-CM | POA: Diagnosis not present

## 2019-04-30 DIAGNOSIS — R918 Other nonspecific abnormal finding of lung field: Secondary | ICD-10-CM | POA: Diagnosis not present

## 2019-05-02 DIAGNOSIS — U071 COVID-19: Secondary | ICD-10-CM | POA: Diagnosis not present

## 2019-05-06 DIAGNOSIS — M6281 Muscle weakness (generalized): Secondary | ICD-10-CM | POA: Diagnosis not present

## 2019-05-06 DIAGNOSIS — E559 Vitamin D deficiency, unspecified: Secondary | ICD-10-CM | POA: Diagnosis not present

## 2019-05-06 DIAGNOSIS — I739 Peripheral vascular disease, unspecified: Secondary | ICD-10-CM | POA: Diagnosis not present

## 2019-05-06 DIAGNOSIS — G4700 Insomnia, unspecified: Secondary | ICD-10-CM | POA: Diagnosis not present

## 2019-05-06 DIAGNOSIS — R739 Hyperglycemia, unspecified: Secondary | ICD-10-CM | POA: Diagnosis not present

## 2019-05-06 DIAGNOSIS — H548 Legal blindness, as defined in USA: Secondary | ICD-10-CM | POA: Diagnosis not present

## 2019-05-06 DIAGNOSIS — F4321 Adjustment disorder with depressed mood: Secondary | ICD-10-CM | POA: Diagnosis not present

## 2019-05-06 DIAGNOSIS — J309 Allergic rhinitis, unspecified: Secondary | ICD-10-CM | POA: Diagnosis not present

## 2019-05-06 DIAGNOSIS — K219 Gastro-esophageal reflux disease without esophagitis: Secondary | ICD-10-CM | POA: Diagnosis not present

## 2019-05-06 DIAGNOSIS — I10 Essential (primary) hypertension: Secondary | ICD-10-CM | POA: Diagnosis not present

## 2019-05-06 DIAGNOSIS — E118 Type 2 diabetes mellitus with unspecified complications: Secondary | ICD-10-CM | POA: Diagnosis not present

## 2019-05-06 DIAGNOSIS — E785 Hyperlipidemia, unspecified: Secondary | ICD-10-CM | POA: Diagnosis not present

## 2019-05-12 DIAGNOSIS — F5101 Primary insomnia: Secondary | ICD-10-CM | POA: Diagnosis not present

## 2019-05-14 DIAGNOSIS — H540X35 Blindness right eye category 3, blindness left eye category 5: Secondary | ICD-10-CM | POA: Diagnosis not present

## 2019-05-14 DIAGNOSIS — E785 Hyperlipidemia, unspecified: Secondary | ICD-10-CM | POA: Diagnosis not present

## 2019-05-14 DIAGNOSIS — M6281 Muscle weakness (generalized): Secondary | ICD-10-CM | POA: Diagnosis not present

## 2019-05-14 DIAGNOSIS — E109 Type 1 diabetes mellitus without complications: Secondary | ICD-10-CM | POA: Diagnosis not present

## 2019-05-14 DIAGNOSIS — I10 Essential (primary) hypertension: Secondary | ICD-10-CM | POA: Diagnosis not present

## 2019-05-14 DIAGNOSIS — M62838 Other muscle spasm: Secondary | ICD-10-CM | POA: Diagnosis not present

## 2019-05-14 DIAGNOSIS — K219 Gastro-esophageal reflux disease without esophagitis: Secondary | ICD-10-CM | POA: Diagnosis not present

## 2019-05-14 DIAGNOSIS — E118 Type 2 diabetes mellitus with unspecified complications: Secondary | ICD-10-CM | POA: Diagnosis not present

## 2019-05-14 DIAGNOSIS — D649 Anemia, unspecified: Secondary | ICD-10-CM | POA: Diagnosis not present

## 2019-05-14 DIAGNOSIS — J309 Allergic rhinitis, unspecified: Secondary | ICD-10-CM | POA: Diagnosis not present

## 2019-05-14 DIAGNOSIS — G609 Hereditary and idiopathic neuropathy, unspecified: Secondary | ICD-10-CM | POA: Diagnosis not present

## 2019-05-14 DIAGNOSIS — G4700 Insomnia, unspecified: Secondary | ICD-10-CM | POA: Diagnosis not present

## 2019-05-14 DIAGNOSIS — F5101 Primary insomnia: Secondary | ICD-10-CM | POA: Diagnosis not present

## 2019-05-14 DIAGNOSIS — R197 Diarrhea, unspecified: Secondary | ICD-10-CM | POA: Diagnosis not present

## 2019-05-14 DIAGNOSIS — N529 Male erectile dysfunction, unspecified: Secondary | ICD-10-CM | POA: Diagnosis not present

## 2019-05-14 DIAGNOSIS — Z0471 Encounter for examination and observation following alleged adult physical abuse: Secondary | ICD-10-CM | POA: Diagnosis not present

## 2019-05-14 DIAGNOSIS — E0829 Diabetes mellitus due to underlying condition with other diabetic kidney complication: Secondary | ICD-10-CM | POA: Diagnosis not present

## 2019-05-14 DIAGNOSIS — G8220 Paraplegia, unspecified: Secondary | ICD-10-CM | POA: Diagnosis not present

## 2019-05-14 DIAGNOSIS — R000 Tachycardia, unspecified: Secondary | ICD-10-CM | POA: Diagnosis not present

## 2019-05-20 DIAGNOSIS — M6281 Muscle weakness (generalized): Secondary | ICD-10-CM | POA: Diagnosis not present

## 2019-06-02 DIAGNOSIS — F5101 Primary insomnia: Secondary | ICD-10-CM | POA: Diagnosis not present

## 2019-06-06 DIAGNOSIS — K219 Gastro-esophageal reflux disease without esophagitis: Secondary | ICD-10-CM | POA: Diagnosis not present

## 2019-06-06 DIAGNOSIS — R197 Diarrhea, unspecified: Secondary | ICD-10-CM | POA: Diagnosis not present

## 2019-06-17 DIAGNOSIS — E118 Type 2 diabetes mellitus with unspecified complications: Secondary | ICD-10-CM | POA: Diagnosis not present

## 2019-06-26 DIAGNOSIS — E0829 Diabetes mellitus due to underlying condition with other diabetic kidney complication: Secondary | ICD-10-CM | POA: Diagnosis not present

## 2019-07-07 DIAGNOSIS — F339 Major depressive disorder, recurrent, unspecified: Secondary | ICD-10-CM | POA: Diagnosis not present

## 2019-07-07 DIAGNOSIS — F5101 Primary insomnia: Secondary | ICD-10-CM | POA: Diagnosis not present

## 2019-07-15 DIAGNOSIS — E118 Type 2 diabetes mellitus with unspecified complications: Secondary | ICD-10-CM | POA: Diagnosis not present

## 2019-07-25 DIAGNOSIS — E118 Type 2 diabetes mellitus with unspecified complications: Secondary | ICD-10-CM | POA: Diagnosis not present

## 2019-07-25 DIAGNOSIS — I739 Peripheral vascular disease, unspecified: Secondary | ICD-10-CM | POA: Diagnosis not present

## 2019-07-25 DIAGNOSIS — F339 Major depressive disorder, recurrent, unspecified: Secondary | ICD-10-CM | POA: Diagnosis not present

## 2019-07-25 DIAGNOSIS — E559 Vitamin D deficiency, unspecified: Secondary | ICD-10-CM | POA: Diagnosis not present

## 2019-07-25 DIAGNOSIS — E785 Hyperlipidemia, unspecified: Secondary | ICD-10-CM | POA: Diagnosis not present

## 2019-07-25 DIAGNOSIS — I10 Essential (primary) hypertension: Secondary | ICD-10-CM | POA: Diagnosis not present

## 2019-07-25 DIAGNOSIS — J309 Allergic rhinitis, unspecified: Secondary | ICD-10-CM | POA: Diagnosis not present

## 2019-07-25 DIAGNOSIS — R197 Diarrhea, unspecified: Secondary | ICD-10-CM | POA: Diagnosis not present

## 2019-07-25 DIAGNOSIS — R739 Hyperglycemia, unspecified: Secondary | ICD-10-CM | POA: Diagnosis not present

## 2019-07-25 DIAGNOSIS — F5101 Primary insomnia: Secondary | ICD-10-CM | POA: Diagnosis not present

## 2019-07-25 DIAGNOSIS — M6281 Muscle weakness (generalized): Secondary | ICD-10-CM | POA: Diagnosis not present

## 2019-07-25 DIAGNOSIS — H548 Legal blindness, as defined in USA: Secondary | ICD-10-CM | POA: Diagnosis not present

## 2019-08-04 DIAGNOSIS — F5101 Primary insomnia: Secondary | ICD-10-CM | POA: Diagnosis not present

## 2019-08-04 DIAGNOSIS — F339 Major depressive disorder, recurrent, unspecified: Secondary | ICD-10-CM | POA: Diagnosis not present

## 2019-08-07 DIAGNOSIS — K219 Gastro-esophageal reflux disease without esophagitis: Secondary | ICD-10-CM | POA: Diagnosis not present

## 2019-08-07 DIAGNOSIS — E118 Type 2 diabetes mellitus with unspecified complications: Secondary | ICD-10-CM | POA: Diagnosis not present

## 2019-08-07 DIAGNOSIS — I10 Essential (primary) hypertension: Secondary | ICD-10-CM | POA: Diagnosis not present

## 2019-08-07 DIAGNOSIS — F339 Major depressive disorder, recurrent, unspecified: Secondary | ICD-10-CM | POA: Diagnosis not present

## 2019-08-07 DIAGNOSIS — H548 Legal blindness, as defined in USA: Secondary | ICD-10-CM | POA: Diagnosis not present

## 2019-08-07 DIAGNOSIS — E785 Hyperlipidemia, unspecified: Secondary | ICD-10-CM | POA: Diagnosis not present

## 2019-08-14 DIAGNOSIS — Z79899 Other long term (current) drug therapy: Secondary | ICD-10-CM | POA: Diagnosis not present

## 2019-08-14 DIAGNOSIS — E559 Vitamin D deficiency, unspecified: Secondary | ICD-10-CM | POA: Diagnosis not present

## 2019-08-14 DIAGNOSIS — F200 Paranoid schizophrenia: Secondary | ICD-10-CM | POA: Diagnosis not present

## 2019-08-19 DIAGNOSIS — E559 Vitamin D deficiency, unspecified: Secondary | ICD-10-CM | POA: Diagnosis not present

## 2019-08-25 DIAGNOSIS — Q845 Enlarged and hypertrophic nails: Secondary | ICD-10-CM | POA: Diagnosis not present

## 2019-08-25 DIAGNOSIS — L603 Nail dystrophy: Secondary | ICD-10-CM | POA: Diagnosis not present

## 2019-08-25 DIAGNOSIS — I739 Peripheral vascular disease, unspecified: Secondary | ICD-10-CM | POA: Diagnosis not present

## 2019-08-25 DIAGNOSIS — E118 Type 2 diabetes mellitus with unspecified complications: Secondary | ICD-10-CM | POA: Diagnosis not present

## 2019-08-25 DIAGNOSIS — B351 Tinea unguium: Secondary | ICD-10-CM | POA: Diagnosis not present

## 2019-08-26 DIAGNOSIS — Z20828 Contact with and (suspected) exposure to other viral communicable diseases: Secondary | ICD-10-CM | POA: Diagnosis not present

## 2019-08-26 DIAGNOSIS — F5101 Primary insomnia: Secondary | ICD-10-CM | POA: Diagnosis not present

## 2019-08-26 DIAGNOSIS — E118 Type 2 diabetes mellitus with unspecified complications: Secondary | ICD-10-CM | POA: Diagnosis not present

## 2019-08-26 DIAGNOSIS — E785 Hyperlipidemia, unspecified: Secondary | ICD-10-CM | POA: Diagnosis not present

## 2019-08-26 DIAGNOSIS — K219 Gastro-esophageal reflux disease without esophagitis: Secondary | ICD-10-CM | POA: Diagnosis not present

## 2019-08-26 DIAGNOSIS — E559 Vitamin D deficiency, unspecified: Secondary | ICD-10-CM | POA: Diagnosis not present

## 2019-08-26 DIAGNOSIS — F4321 Adjustment disorder with depressed mood: Secondary | ICD-10-CM | POA: Diagnosis not present

## 2019-08-26 DIAGNOSIS — H548 Legal blindness, as defined in USA: Secondary | ICD-10-CM | POA: Diagnosis not present

## 2019-08-26 DIAGNOSIS — J309 Allergic rhinitis, unspecified: Secondary | ICD-10-CM | POA: Diagnosis not present

## 2019-08-26 DIAGNOSIS — M6281 Muscle weakness (generalized): Secondary | ICD-10-CM | POA: Diagnosis not present

## 2019-08-26 DIAGNOSIS — I10 Essential (primary) hypertension: Secondary | ICD-10-CM | POA: Diagnosis not present

## 2019-08-27 DIAGNOSIS — Z1159 Encounter for screening for other viral diseases: Secondary | ICD-10-CM | POA: Diagnosis not present

## 2019-08-27 DIAGNOSIS — I739 Peripheral vascular disease, unspecified: Secondary | ICD-10-CM | POA: Diagnosis not present

## 2019-08-27 DIAGNOSIS — E118 Type 2 diabetes mellitus with unspecified complications: Secondary | ICD-10-CM | POA: Diagnosis not present

## 2019-08-27 DIAGNOSIS — M6281 Muscle weakness (generalized): Secondary | ICD-10-CM | POA: Diagnosis not present

## 2019-08-27 DIAGNOSIS — I10 Essential (primary) hypertension: Secondary | ICD-10-CM | POA: Diagnosis not present

## 2019-08-27 DIAGNOSIS — R296 Repeated falls: Secondary | ICD-10-CM | POA: Diagnosis not present

## 2019-08-27 DIAGNOSIS — D649 Anemia, unspecified: Secondary | ICD-10-CM | POA: Diagnosis not present

## 2019-08-27 DIAGNOSIS — G8220 Paraplegia, unspecified: Secondary | ICD-10-CM | POA: Diagnosis not present

## 2019-08-27 DIAGNOSIS — E785 Hyperlipidemia, unspecified: Secondary | ICD-10-CM | POA: Diagnosis not present

## 2019-08-27 DIAGNOSIS — F5101 Primary insomnia: Secondary | ICD-10-CM | POA: Diagnosis not present

## 2019-08-27 DIAGNOSIS — J309 Allergic rhinitis, unspecified: Secondary | ICD-10-CM | POA: Diagnosis not present

## 2019-08-27 DIAGNOSIS — E559 Vitamin D deficiency, unspecified: Secondary | ICD-10-CM | POA: Diagnosis not present

## 2019-08-27 DIAGNOSIS — N529 Male erectile dysfunction, unspecified: Secondary | ICD-10-CM | POA: Diagnosis not present

## 2019-08-27 DIAGNOSIS — F339 Major depressive disorder, recurrent, unspecified: Secondary | ICD-10-CM | POA: Diagnosis not present

## 2019-08-27 DIAGNOSIS — H548 Legal blindness, as defined in USA: Secondary | ICD-10-CM | POA: Diagnosis not present

## 2019-08-27 DIAGNOSIS — G4700 Insomnia, unspecified: Secondary | ICD-10-CM | POA: Diagnosis not present

## 2019-08-27 DIAGNOSIS — F4321 Adjustment disorder with depressed mood: Secondary | ICD-10-CM | POA: Diagnosis not present

## 2019-08-27 DIAGNOSIS — K219 Gastro-esophageal reflux disease without esophagitis: Secondary | ICD-10-CM | POA: Diagnosis not present

## 2019-09-02 DIAGNOSIS — M6281 Muscle weakness (generalized): Secondary | ICD-10-CM | POA: Diagnosis not present

## 2019-09-08 DIAGNOSIS — F4321 Adjustment disorder with depressed mood: Secondary | ICD-10-CM | POA: Diagnosis not present

## 2019-09-08 DIAGNOSIS — F339 Major depressive disorder, recurrent, unspecified: Secondary | ICD-10-CM | POA: Diagnosis not present

## 2019-09-08 DIAGNOSIS — F5101 Primary insomnia: Secondary | ICD-10-CM | POA: Diagnosis not present

## 2019-09-16 DIAGNOSIS — E118 Type 2 diabetes mellitus with unspecified complications: Secondary | ICD-10-CM | POA: Diagnosis not present

## 2019-10-27 DIAGNOSIS — F339 Major depressive disorder, recurrent, unspecified: Secondary | ICD-10-CM | POA: Diagnosis not present

## 2019-10-27 DIAGNOSIS — F5101 Primary insomnia: Secondary | ICD-10-CM | POA: Diagnosis not present

## 2019-10-27 DIAGNOSIS — F4321 Adjustment disorder with depressed mood: Secondary | ICD-10-CM | POA: Diagnosis not present

## 2019-10-28 DIAGNOSIS — K219 Gastro-esophageal reflux disease without esophagitis: Secondary | ICD-10-CM | POA: Diagnosis not present

## 2019-10-28 DIAGNOSIS — I10 Essential (primary) hypertension: Secondary | ICD-10-CM | POA: Diagnosis not present

## 2019-10-28 DIAGNOSIS — H548 Legal blindness, as defined in USA: Secondary | ICD-10-CM | POA: Diagnosis not present

## 2019-10-28 DIAGNOSIS — I739 Peripheral vascular disease, unspecified: Secondary | ICD-10-CM | POA: Diagnosis not present

## 2019-10-28 DIAGNOSIS — Q845 Enlarged and hypertrophic nails: Secondary | ICD-10-CM | POA: Diagnosis not present

## 2019-10-28 DIAGNOSIS — E559 Vitamin D deficiency, unspecified: Secondary | ICD-10-CM | POA: Diagnosis not present

## 2019-10-28 DIAGNOSIS — J309 Allergic rhinitis, unspecified: Secondary | ICD-10-CM | POA: Diagnosis not present

## 2019-10-28 DIAGNOSIS — E118 Type 2 diabetes mellitus with unspecified complications: Secondary | ICD-10-CM | POA: Diagnosis not present

## 2019-10-28 DIAGNOSIS — L603 Nail dystrophy: Secondary | ICD-10-CM | POA: Diagnosis not present

## 2019-10-28 DIAGNOSIS — B351 Tinea unguium: Secondary | ICD-10-CM | POA: Diagnosis not present

## 2019-10-28 DIAGNOSIS — M6281 Muscle weakness (generalized): Secondary | ICD-10-CM | POA: Diagnosis not present

## 2019-10-28 DIAGNOSIS — E785 Hyperlipidemia, unspecified: Secondary | ICD-10-CM | POA: Diagnosis not present

## 2019-10-28 DIAGNOSIS — F5101 Primary insomnia: Secondary | ICD-10-CM | POA: Diagnosis not present

## 2019-11-24 DIAGNOSIS — F5101 Primary insomnia: Secondary | ICD-10-CM | POA: Diagnosis not present

## 2019-11-24 DIAGNOSIS — F339 Major depressive disorder, recurrent, unspecified: Secondary | ICD-10-CM | POA: Diagnosis not present

## 2019-11-24 DIAGNOSIS — M6281 Muscle weakness (generalized): Secondary | ICD-10-CM | POA: Diagnosis not present

## 2019-11-25 DIAGNOSIS — M25531 Pain in right wrist: Secondary | ICD-10-CM | POA: Diagnosis not present

## 2019-12-02 DIAGNOSIS — J309 Allergic rhinitis, unspecified: Secondary | ICD-10-CM | POA: Diagnosis not present

## 2019-12-02 DIAGNOSIS — H548 Legal blindness, as defined in USA: Secondary | ICD-10-CM | POA: Diagnosis not present

## 2019-12-02 DIAGNOSIS — M6281 Muscle weakness (generalized): Secondary | ICD-10-CM | POA: Diagnosis not present

## 2019-12-02 DIAGNOSIS — I10 Essential (primary) hypertension: Secondary | ICD-10-CM | POA: Diagnosis not present

## 2019-12-02 DIAGNOSIS — I739 Peripheral vascular disease, unspecified: Secondary | ICD-10-CM | POA: Diagnosis not present

## 2019-12-02 DIAGNOSIS — E785 Hyperlipidemia, unspecified: Secondary | ICD-10-CM | POA: Diagnosis not present

## 2019-12-02 DIAGNOSIS — E559 Vitamin D deficiency, unspecified: Secondary | ICD-10-CM | POA: Diagnosis not present

## 2019-12-02 DIAGNOSIS — K219 Gastro-esophageal reflux disease without esophagitis: Secondary | ICD-10-CM | POA: Diagnosis not present

## 2019-12-04 DIAGNOSIS — I739 Peripheral vascular disease, unspecified: Secondary | ICD-10-CM | POA: Diagnosis not present

## 2019-12-04 DIAGNOSIS — H548 Legal blindness, as defined in USA: Secondary | ICD-10-CM | POA: Diagnosis not present

## 2019-12-04 DIAGNOSIS — F339 Major depressive disorder, recurrent, unspecified: Secondary | ICD-10-CM | POA: Diagnosis not present

## 2019-12-04 DIAGNOSIS — M6281 Muscle weakness (generalized): Secondary | ICD-10-CM | POA: Diagnosis not present

## 2019-12-04 DIAGNOSIS — E785 Hyperlipidemia, unspecified: Secondary | ICD-10-CM | POA: Diagnosis not present

## 2019-12-04 DIAGNOSIS — E118 Type 2 diabetes mellitus with unspecified complications: Secondary | ICD-10-CM | POA: Diagnosis not present

## 2019-12-04 DIAGNOSIS — J309 Allergic rhinitis, unspecified: Secondary | ICD-10-CM | POA: Diagnosis not present

## 2019-12-22 DIAGNOSIS — F4321 Adjustment disorder with depressed mood: Secondary | ICD-10-CM | POA: Diagnosis not present

## 2019-12-22 DIAGNOSIS — F339 Major depressive disorder, recurrent, unspecified: Secondary | ICD-10-CM | POA: Diagnosis not present

## 2019-12-22 DIAGNOSIS — G4700 Insomnia, unspecified: Secondary | ICD-10-CM | POA: Diagnosis not present

## 2019-12-22 DIAGNOSIS — F5101 Primary insomnia: Secondary | ICD-10-CM | POA: Diagnosis not present

## 2019-12-30 DIAGNOSIS — F4321 Adjustment disorder with depressed mood: Secondary | ICD-10-CM | POA: Diagnosis not present

## 2019-12-30 DIAGNOSIS — G4700 Insomnia, unspecified: Secondary | ICD-10-CM | POA: Diagnosis not present

## 2019-12-30 DIAGNOSIS — H548 Legal blindness, as defined in USA: Secondary | ICD-10-CM | POA: Diagnosis not present

## 2019-12-30 DIAGNOSIS — E559 Vitamin D deficiency, unspecified: Secondary | ICD-10-CM | POA: Diagnosis not present

## 2019-12-30 DIAGNOSIS — K219 Gastro-esophageal reflux disease without esophagitis: Secondary | ICD-10-CM | POA: Diagnosis not present

## 2019-12-30 DIAGNOSIS — F339 Major depressive disorder, recurrent, unspecified: Secondary | ICD-10-CM | POA: Diagnosis not present

## 2019-12-30 DIAGNOSIS — M6281 Muscle weakness (generalized): Secondary | ICD-10-CM | POA: Diagnosis not present

## 2019-12-30 DIAGNOSIS — J309 Allergic rhinitis, unspecified: Secondary | ICD-10-CM | POA: Diagnosis not present

## 2019-12-30 DIAGNOSIS — E785 Hyperlipidemia, unspecified: Secondary | ICD-10-CM | POA: Diagnosis not present

## 2019-12-30 DIAGNOSIS — F5101 Primary insomnia: Secondary | ICD-10-CM | POA: Diagnosis not present

## 2019-12-30 DIAGNOSIS — I10 Essential (primary) hypertension: Secondary | ICD-10-CM | POA: Diagnosis not present

## 2019-12-30 DIAGNOSIS — E118 Type 2 diabetes mellitus with unspecified complications: Secondary | ICD-10-CM | POA: Diagnosis not present

## 2020-01-22 DIAGNOSIS — F331 Major depressive disorder, recurrent, moderate: Secondary | ICD-10-CM | POA: Diagnosis not present

## 2020-01-30 DIAGNOSIS — J309 Allergic rhinitis, unspecified: Secondary | ICD-10-CM | POA: Diagnosis not present

## 2020-01-30 DIAGNOSIS — I739 Peripheral vascular disease, unspecified: Secondary | ICD-10-CM | POA: Diagnosis not present

## 2020-01-30 DIAGNOSIS — M6281 Muscle weakness (generalized): Secondary | ICD-10-CM | POA: Diagnosis not present

## 2020-01-30 DIAGNOSIS — F339 Major depressive disorder, recurrent, unspecified: Secondary | ICD-10-CM | POA: Diagnosis not present

## 2020-01-30 DIAGNOSIS — R600 Localized edema: Secondary | ICD-10-CM | POA: Diagnosis not present

## 2020-01-30 DIAGNOSIS — F331 Major depressive disorder, recurrent, moderate: Secondary | ICD-10-CM | POA: Diagnosis not present

## 2020-01-30 DIAGNOSIS — H548 Legal blindness, as defined in USA: Secondary | ICD-10-CM | POA: Diagnosis not present

## 2020-01-30 DIAGNOSIS — I10 Essential (primary) hypertension: Secondary | ICD-10-CM | POA: Diagnosis not present

## 2020-01-30 DIAGNOSIS — E559 Vitamin D deficiency, unspecified: Secondary | ICD-10-CM | POA: Diagnosis not present

## 2020-01-30 DIAGNOSIS — E118 Type 2 diabetes mellitus with unspecified complications: Secondary | ICD-10-CM | POA: Diagnosis not present

## 2020-01-30 DIAGNOSIS — K219 Gastro-esophageal reflux disease without esophagitis: Secondary | ICD-10-CM | POA: Diagnosis not present

## 2020-01-30 DIAGNOSIS — E785 Hyperlipidemia, unspecified: Secondary | ICD-10-CM | POA: Diagnosis not present

## 2020-02-04 DIAGNOSIS — I739 Peripheral vascular disease, unspecified: Secondary | ICD-10-CM | POA: Diagnosis not present

## 2020-02-04 DIAGNOSIS — B351 Tinea unguium: Secondary | ICD-10-CM | POA: Diagnosis not present

## 2020-02-04 DIAGNOSIS — Q845 Enlarged and hypertrophic nails: Secondary | ICD-10-CM | POA: Diagnosis not present

## 2020-02-04 DIAGNOSIS — R600 Localized edema: Secondary | ICD-10-CM | POA: Diagnosis not present

## 2020-02-04 DIAGNOSIS — E118 Type 2 diabetes mellitus with unspecified complications: Secondary | ICD-10-CM | POA: Diagnosis not present

## 2020-02-05 DIAGNOSIS — E118 Type 2 diabetes mellitus with unspecified complications: Secondary | ICD-10-CM | POA: Diagnosis not present

## 2020-02-05 DIAGNOSIS — H548 Legal blindness, as defined in USA: Secondary | ICD-10-CM | POA: Diagnosis not present

## 2020-02-05 DIAGNOSIS — M6281 Muscle weakness (generalized): Secondary | ICD-10-CM | POA: Diagnosis not present

## 2020-02-05 DIAGNOSIS — E785 Hyperlipidemia, unspecified: Secondary | ICD-10-CM | POA: Diagnosis not present

## 2020-02-05 DIAGNOSIS — F331 Major depressive disorder, recurrent, moderate: Secondary | ICD-10-CM | POA: Diagnosis not present

## 2020-02-05 DIAGNOSIS — I10 Essential (primary) hypertension: Secondary | ICD-10-CM | POA: Diagnosis not present

## 2020-03-02 DIAGNOSIS — F3341 Major depressive disorder, recurrent, in partial remission: Secondary | ICD-10-CM | POA: Diagnosis not present

## 2020-03-02 DIAGNOSIS — F331 Major depressive disorder, recurrent, moderate: Secondary | ICD-10-CM | POA: Diagnosis not present

## 2020-03-16 DIAGNOSIS — E118 Type 2 diabetes mellitus with unspecified complications: Secondary | ICD-10-CM | POA: Diagnosis not present

## 2020-03-19 DIAGNOSIS — J309 Allergic rhinitis, unspecified: Secondary | ICD-10-CM | POA: Diagnosis not present

## 2020-03-19 DIAGNOSIS — E785 Hyperlipidemia, unspecified: Secondary | ICD-10-CM | POA: Diagnosis not present

## 2020-03-19 DIAGNOSIS — K219 Gastro-esophageal reflux disease without esophagitis: Secondary | ICD-10-CM | POA: Diagnosis not present

## 2020-03-19 DIAGNOSIS — F3341 Major depressive disorder, recurrent, in partial remission: Secondary | ICD-10-CM | POA: Diagnosis not present

## 2020-03-19 DIAGNOSIS — M6281 Muscle weakness (generalized): Secondary | ICD-10-CM | POA: Diagnosis not present

## 2020-03-19 DIAGNOSIS — E118 Type 2 diabetes mellitus with unspecified complications: Secondary | ICD-10-CM | POA: Diagnosis not present

## 2020-03-19 DIAGNOSIS — R600 Localized edema: Secondary | ICD-10-CM | POA: Diagnosis not present

## 2020-03-19 DIAGNOSIS — F331 Major depressive disorder, recurrent, moderate: Secondary | ICD-10-CM | POA: Diagnosis not present

## 2020-03-19 DIAGNOSIS — I739 Peripheral vascular disease, unspecified: Secondary | ICD-10-CM | POA: Diagnosis not present

## 2020-03-19 DIAGNOSIS — H548 Legal blindness, as defined in USA: Secondary | ICD-10-CM | POA: Diagnosis not present

## 2020-03-19 DIAGNOSIS — I10 Essential (primary) hypertension: Secondary | ICD-10-CM | POA: Diagnosis not present

## 2020-03-19 DIAGNOSIS — E559 Vitamin D deficiency, unspecified: Secondary | ICD-10-CM | POA: Diagnosis not present

## 2020-03-23 DIAGNOSIS — E118 Type 2 diabetes mellitus with unspecified complications: Secondary | ICD-10-CM | POA: Diagnosis not present

## 2020-03-26 IMAGING — CR PORTABLE CHEST - 1 VIEW
1 series · 1 of 1 positions shown · non-contrast
Comparison: None.

CLINICAL DATA: Cough.  Hyperglycemia

EXAM:
PORTABLE CHEST 1 VIEW

[portable]
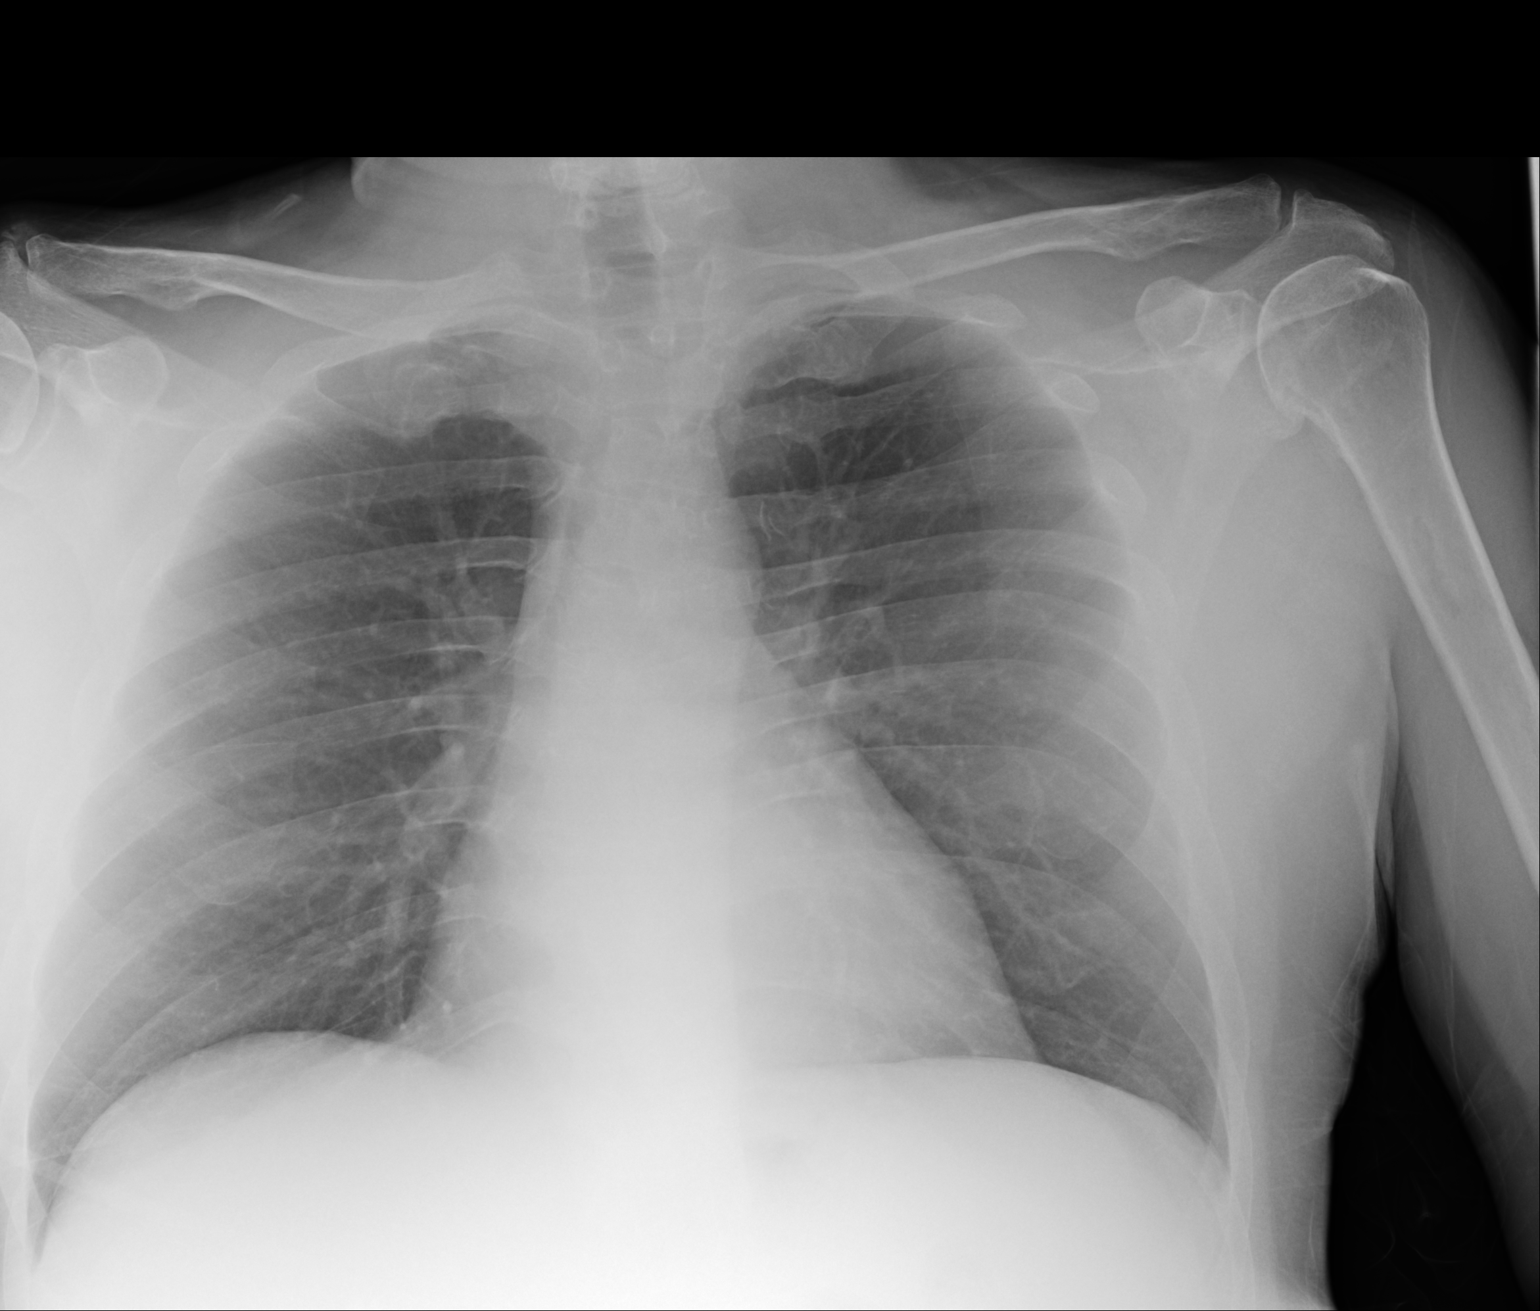

[1 of 1 positions shown; findings below may reference images not displayed]

FINDINGS: Lungs are clear. Heart size and pulmonary vascularity are normal. No
adenopathy. No bone lesions.
IMPRESSION: No edema or consolidation.

## 2020-03-29 DIAGNOSIS — F331 Major depressive disorder, recurrent, moderate: Secondary | ICD-10-CM | POA: Diagnosis not present
